# Patient Record
Sex: Female | Born: 1950 | Race: White | Hispanic: No | Marital: Married | Smoking: Never smoker
Health system: Southern US, Community
[De-identification: ages and names within clinical notes are randomized; demographics above are authoritative.]

## PROBLEM LIST (undated history)

## (undated) DIAGNOSIS — C801 Malignant (primary) neoplasm, unspecified: Secondary | ICD-10-CM

## (undated) DIAGNOSIS — M810 Age-related osteoporosis without current pathological fracture: Secondary | ICD-10-CM

## (undated) DIAGNOSIS — E079 Disorder of thyroid, unspecified: Secondary | ICD-10-CM

## (undated) HISTORY — PX: NO PAST SURGERIES: SHX2092

## (undated) HISTORY — DX: Age-related osteoporosis without current pathological fracture: M81.0

---

## 2006-10-27 ENCOUNTER — Ambulatory Visit: Payer: Self-pay | Admitting: Nurse Practitioner

## 2007-03-18 ENCOUNTER — Ambulatory Visit: Payer: Self-pay | Admitting: Unknown Physician Specialty

## 2007-11-10 ENCOUNTER — Ambulatory Visit: Payer: Self-pay | Admitting: Nurse Practitioner

## 2008-12-17 ENCOUNTER — Ambulatory Visit: Payer: Self-pay | Admitting: Nurse Practitioner

## 2008-12-20 ENCOUNTER — Ambulatory Visit: Payer: Self-pay | Admitting: Nurse Practitioner

## 2009-12-21 ENCOUNTER — Ambulatory Visit: Payer: Self-pay | Admitting: Internal Medicine

## 2009-12-26 ENCOUNTER — Ambulatory Visit: Payer: Self-pay | Admitting: Family Medicine

## 2011-01-28 ENCOUNTER — Ambulatory Visit: Payer: Self-pay

## 2012-01-29 ENCOUNTER — Ambulatory Visit: Payer: Self-pay | Admitting: Family Medicine

## 2013-02-01 ENCOUNTER — Ambulatory Visit: Payer: Self-pay | Admitting: Family Medicine

## 2014-10-20 ENCOUNTER — Ambulatory Visit: Payer: Managed Care, Other (non HMO)

## 2014-10-20 ENCOUNTER — Ambulatory Visit
Admission: EM | Admit: 2014-10-20 | Discharge: 2014-10-20 | Disposition: A | Discharge status: Home / Self Care | Payer: Managed Care, Other (non HMO) | Attending: Family Medicine | Admitting: Family Medicine

## 2014-10-20 ENCOUNTER — Encounter: Payer: Self-pay | Admitting: Emergency Medicine

## 2014-10-20 DIAGNOSIS — S41112A Laceration without foreign body of left upper arm, initial encounter: Secondary | ICD-10-CM

## 2014-10-20 DIAGNOSIS — S60312A Abrasion of left thumb, initial encounter: Secondary | ICD-10-CM

## 2014-10-20 DIAGNOSIS — S60212A Contusion of left wrist, initial encounter: Secondary | ICD-10-CM

## 2014-10-20 DIAGNOSIS — S61512A Laceration without foreign body of left wrist, initial encounter: Secondary | ICD-10-CM

## 2014-10-20 HISTORY — DX: Disorder of thyroid, unspecified: E07.9

## 2014-10-20 MED ORDER — BACITRACIN ZINC 500 UNIT/GM EX OINT: TOPICAL_OINTMENT | Freq: Once | CUTANEOUS | Status: DC

## 2014-10-20 MED ORDER — LIDOCAINE HCL (PF) 1 % IJ SOLN: 10.0000 mL | Freq: Once | INTRAMUSCULAR | Status: DC

## 2014-10-20 NOTE — ED Notes (Signed)
Was walking in her house and fell and glass jars from grocery broke and cut her right wrist and right thumb

## 2014-10-20 NOTE — Discharge Instructions (Signed)
Keep area clean and dry. Clean daily with soap and water, rinse, pat dry, then apply topical antibiotic ointment such as Neosporin. Keep covered when at work but allow some open to air time when at home as discussed.  Wear brace for 3 or 4 days to allow laceration to heal and rest wrist.  Return to the urgent care in 7-10 days for suture removal. Return sooner for redness, increased pain, swelling, drainage, new or worsening concerns.    Laceration Care, Adult A laceration is a cut that goes through all layers of the skin. The cut goes into the tissue beneath the skin. HOME CARE For stitches (sutures) or staples:  Keep the cut clean and dry.  If you have a bandage (dressing), change it at least once a day. Change the bandage if it gets wet or dirty, or as told by your doctor.  Wash the cut with soap and water 2 times a day. Rinse the cut with water. Pat it dry with a clean towel.  Put a thin layer of medicated cream on the cut as told by your doctor.  You may shower after the first 24 hours. Do not soak the cut in water until the stitches are removed.  Only take medicines as told by your doctor.  Have your stitches or staples removed as told by your doctor. For skin adhesive strips:  Keep the cut clean and dry.  Do not get the strips wet. You may take a bath, but be careful to keep the cut dry.  If the cut gets wet, pat it dry with a clean towel.  The strips will fall off on their own. Do not remove the strips that are still stuck to the cut. For wound glue:  You may shower or take baths. Do not soak or scrub the cut. Do not swim. Avoid heavy sweating until the glue falls off on its own. After a shower or bath, pat the cut dry with a clean towel.  Do not put medicine on your cut until the glue falls off.  If you have a bandage, do not put tape over the glue.  Avoid lots of sunlight or tanning lamps until the glue falls off. Put sunscreen on the cut for the first year to  reduce your scar.  The glue will fall off on its own. Do not pick at the glue. You may need a tetanus shot if:  You cannot remember when you had your last tetanus shot.  You have never had a tetanus shot. If you need a tetanus shot and you choose not to have one, you may get tetanus. Sickness from tetanus can be serious. GET HELP RIGHT AWAY IF:   Your pain does not get better with medicine.  Your arm, hand, leg, or foot loses feeling (numbness) or changes color.  Your cut is bleeding.  Your joint feels weak, or you cannot use your joint.  You have painful lumps on your body.  Your cut is red, puffy (swollen), or painful.  You have a red line on the skin near the cut.  You have yellowish-white fluid (pus) coming from the cut.  You have a fever.  You have a bad smell coming from the cut or bandage.  Your cut breaks open before or after stitches are removed.  You notice something coming out of the cut, such as wood or glass.  You cannot move a finger or toe. MAKE SURE YOU:   Understand these instructions.  Will watch your condition.  Will get help right away if you are not doing well or get worse. Document Released: 07/29/2007 Document Revised: 05/04/2011 Document Reviewed: 08/05/2010 [facility] Patient Information 2015 [place], [place]. This information is not intended to replace advice given to you by your health care provider. Make sure you discuss any questions you have with your health care provider.  Abrasion An abrasion is a cut or scrape of the skin. Abrasions do not extend through all layers of the skin and most heal within 10 days. It is important to care for your abrasion properly to prevent infection. CAUSES  Most abrasions are caused by falling on, or gliding across, the ground or other surface. When your skin rubs on something, the outer and inner layer of skin rubs off, causing an abrasion. DIAGNOSIS  Your caregiver will be able to diagnose an abrasion  during a physical exam.  TREATMENT  Your treatment depends on how large and deep the abrasion is. Generally, your abrasion will be cleaned with water and a mild soap to remove any dirt or debris. An antibiotic ointment may be put over the abrasion to prevent an infection. A bandage (dressing) may be wrapped around the abrasion to keep it from getting dirty.  You may need a tetanus shot if:  You cannot remember when you had your last tetanus shot.  You have never had a tetanus shot.  The injury broke your skin. If you get a tetanus shot, your arm may swell, get red, and feel warm to the touch. This is common and not a problem. If you need a tetanus shot and you choose not to have one, there is a rare chance of getting tetanus. Sickness from tetanus can be serious.  HOME CARE INSTRUCTIONS   If a dressing was applied, change it at least once a day or as directed by your caregiver. If the bandage sticks, soak it off with warm water.   Wash the area with water and a mild soap to remove all the ointment 2 times a day. Rinse off the soap and pat the area dry with a clean towel.   Reapply any ointment as directed by your caregiver. This will help prevent infection and keep the bandage from sticking. Use gauze over the wound and under the dressing to help keep the bandage from sticking.   Change your dressing right away if it becomes wet or dirty.   Only take over-the-counter or prescription medicines for pain, discomfort, or fever as directed by your caregiver.   Follow up with your caregiver within 24-48 hours for a wound check, or as directed. If you were not given a wound-check appointment, look closely at your abrasion for redness, swelling, or pus. These are signs of infection. SEEK IMMEDIATE MEDICAL CARE IF:   You have increasing pain in the wound.   You have redness, swelling, or tenderness around the wound.   You have pus coming from the wound.   You have a fever or persistent  symptoms for more than 2-3 days.  You have a fever and your symptoms suddenly get worse.  You have a bad smell coming from the wound or dressing.  MAKE SURE YOU:   Understand these instructions.  Will watch your condition.  Will get help right away if you are not doing well or get worse. Document Released: 11/19/2004 Document Revised: 01/27/2012 Document Reviewed: 01/13/2011 [facility] Patient Information 2015 [place], [place]. This information is not intended to replace advice given to  you by your health care provider. Make sure you discuss any questions you have with your health care provider.  Contusion A contusion is a deep bruise. Contusions happen when an injury causes bleeding under the skin. Signs of bruising include pain, puffiness (swelling), and discolored skin. The contusion may turn blue, purple, or yellow. HOME CARE   Put ice on the injured area.  Put ice in a plastic bag.  Place a towel between your skin and the bag.  Leave the ice on for 15-20 minutes, 03-04 times a day.  Only take medicine as told by your doctor.  Rest the injured area.  If possible, raise (elevate) the injured area to lessen puffiness. GET HELP RIGHT AWAY IF:   You have more bruising or puffiness.  You have pain that is getting worse.  Your puffiness or pain is not helped by medicine. MAKE SURE YOU:   Understand these instructions.  Will watch your condition.  Will get help right away if you are not doing well or get worse. Document Released: 07/29/2007 Document Revised: 05/04/2011 Document Reviewed: 12/15/2010 [facility] Patient Information 2015 [place], [place]. This information is not intended to replace advice given to you by your health care provider. Make sure you discuss any questions you have with your health care provider.

## 2014-10-20 NOTE — ED Provider Notes (Signed)
[facility] Emergency Department Provider Note  ____________________________________________  Time seen: Approximately 12:10 PM  I have reviewed the triage vital signs and the nursing notes.   HISTORY  Chief Complaint Laceration   HPI Janice Wilkinson is a 64 y.o. female presents due to laceration. Patient reports that just prior to arrival she and her husband were bringing in groceries. Patient states that she has 4 brick steps into her house and states that she tripped at the bottom step causing her to fall forward. Patient states that she caught self with the right wrist but states glass spaghetti jar broke at the same time if she catching herself causing lacerations to right wrist, right arm and right hand.Patient reports mild pain to right wrist. Denies head injury or loss of consciousness. Denies other injury. States current pain is 3 out of 10 described as aching pain. Denies numbness or tingling sensation. Denies difficulty or inability to use fingers.  Reports last tetanus immunization less than 5 years ago.   Past Medical History  Diagnosis Date  . Thyroid disease     There are no active problems to display for this patient.   Past Surgical History  Procedure Laterality Date  . No past surgeries      Current Outpatient Rx  Name  Route  Sig  Dispense  Refill  . fluticasone (FLONASE) 50 MCG/ACT nasal spray   Each Nare   Place into both nostrils daily.         Marland Kitchen levothyroxine (SYNTHROID, LEVOTHROID) 88 MCG tablet   Oral   Take 88 mcg by mouth daily before breakfast.           Allergies Review of patient's allergies indicates no known allergies.  No family history on file.  Social History Social History  Substance Use Topics  . Smoking status: Never Smoker   . Smokeless tobacco: Never Used  . Alcohol Use: 0.6 oz/week    1 Glasses of wine per week    Review of Systems Constitutional: No fever/chills Eyes: No visual  changes. ENT: No sore throat. Cardiovascular: Denies chest pain. Respiratory: Denies shortness of breath. Gastrointestinal: No abdominal pain.  No nausea, no vomiting.  No diarrhea.  No constipation. Genitourinary: Negative for dysuria. Musculoskeletal: Negative for back pain.right wrist pain.  Skin: Negative for rash.right arm laceration as above.  Neurological: Negative for headaches, focal weakness or numbness.  10-point ROS otherwise negative.  ____________________________________________   PHYSICAL EXAM:  VITAL SIGNS: ED Triage Vitals  Enc Vitals Group     BP 10/20/14 1135 142/70 mmHg     Pulse Rate 10/20/14 1135 64     Resp 10/20/14 1135 16     Temp 10/20/14 1135 98.1 F (36.7 C)     Temp Source 10/20/14 1135 Tympanic     SpO2 10/20/14 1135 98 %     Weight 10/20/14 1135 155 lb (70.308 kg)     Height 10/20/14 1135 5\' 6"  (1.676 m)     Head Cir --      Peak Flow --      Pain Score 10/20/14 1137 4     Pain Loc --      Pain Edu? --      Excl. in [place]? --     Constitutional: Alert and oriented. Well appearing and in no acute distress. Eyes: Conjunctivae are normal. PERRL. EOMI. Head: Atraumatic.  Nose: No congestion/rhinnorhea.  Mouth/Throat: Mucous membranes are moist.  Neck: No stridor.  No cervical spine  tenderness to palpation. Hematological/Lymphatic/Immunilogical: No cervical lymphadenopathy. Cardiovascular: Normal rate, regular rhythm. Grossly normal heart sounds.  Good peripheral circulation. Respiratory: Normal respiratory effort.  No retractions. Lungs CTAB. Gastrointestinal: Soft and nontender. No distention. Normal Bowel sounds.   Musculoskeletal: No lower or upper extremity tenderness nor edema.  No joint effusions.. No cervical, thoracic or lumbar tenderness to palpation. Full range of motion to bilateral upper and lower extremities. Except: Right lateral wrist mild TTP with laceration present, full ROM. No motor or tendon deficit to right wrist or right  hand fingers or right forearm.   Neurologic:  Normal speech and language. No gross focal neurologic deficits are appreciated. No gait instability. Skin:  Skin is warm, dry and intact. No rash noted. Except: right lateral wrist 6 cm superficial laceration, mild TTP, no palpable or visualized foreign body, no tendon exposure. Right palmer surface distal thumb superficial abrasions, no laceration repair indicated to thumb, right thumb full rOM, no motor, tendon or sensation deficits. Right lateral forearm with small x3 superficial <0.5 cm laceration, nontender.  Psychiatric: Mood and affect are normal. Speech and behavior are normal.  ____________________________________________   LABS (all labs ordered are listed, but only abnormal results are displayed)  Labs Reviewed - No data to display  RADIOLOGY EXAM: RIGHT WRIST - COMPLETE 3+ VIEW  COMPARISON: Right thumb radiographs - earlier same day  FINDINGS: No fracture or dislocation. Joint spaces are preserved. There are 2 punctate ossicles adjacent to the distal aspect of the proximal phalanx of the thumb. Regional soft tissues appear otherwise normal. No radiopaque foreign body.  IMPRESSION: No fracture, dislocation or radiopaque foreign body.   Electronically Signed By: Sandi Mariscal M.D. On: 10/20/2014 12:31          DG Finger Thumb Right (Final result) Result time: 10/20/14 12:33:53   Final result by Rad Results In Interface (10/20/14 12:33:53)   Narrative:   CLINICAL DATA: 64 year old female with laceration in the lateral aspect of the wrist in the anterior aspect of the thumb from glass.  EXAM: RIGHT THUMB 2+V  COMPARISON: No priors.  FINDINGS: There is no evidence of fracture or dislocation. 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 There is no focal bone abnormality. Soft tissues are unremarkable  IMPRESSION: Negative.   Electronically Signed By: Vinnie Langton M.D. On: 10/20/2014 12:33   I, Marylene Land, personally viewed and evaluated these images (plain radiographs) as part of my medical decision making.   ____________________________________________   PROCEDURES  Procedure(s) performed:  Procedure explained and verbal consent obtained.  Location: Right wrist Size: 6cm Anesthesia with 1% Lidocaine  Wound cleansed, debrided of visible foreign material.  Copious irrigation with saline and betadine.  Repaired with 5-0 nylon sutures Simple interrupted Suture # 11   Right thumb and x 3 right forearm lacerations, no suture repair indicated. Wound cleansed, debrided of visible foreign material.  Copious irrigation with saline and betadine. X one steristrip applied to each wound (x 4 total).   Antibiotic ointment and dressing applied.  Wound care instructions provided.  Observe for any signs of infection or other problems.  Patient tolerate procedure well.   Right wrist velcro splint applied by RN. Neurovascular intact post application.  ______________________________________   INITIAL IMPRESSION / ASSESSMENT AND PLAN / ED COURSE  Pertinent labs & imaging results that were available during my care of the patient were reviewed by me and considered in my medical decision making (see chart for details).  Presents for complaint of  laceration post mechanical fall. Denies head injury or LOC. Lacerations repaired.Dressing applied, right wrist velcro splint applied for support.  Patient tolerated well. REturn to urgent care for suture removal in 7-10 days. Discussed follow up and return parameters. Patient and family verbalized understanding and agreed to plan.  ____________________________________________   FINAL CLINICAL IMPRESSION(S) / ED DIAGNOSES  Final diagnoses:  Wrist laceration, left, initial encounter  Arm laceration, left, initial encounter  Abrasion of left thumb, initial encounter  Wrist contusion, left, initial  encounter       Marylene Land, NP 10/20/14 1611

## 2014-12-04 ENCOUNTER — Encounter: Payer: Self-pay | Admitting: Physical Therapy

## 2014-12-04 ENCOUNTER — Ambulatory Visit: Payer: Managed Care, Other (non HMO) | Attending: Obstetrics and Gynecology | Admitting: Physical Therapy

## 2014-12-04 VITALS — BP 132/100

## 2014-12-04 DIAGNOSIS — R279 Unspecified lack of coordination: Secondary | ICD-10-CM | POA: Diagnosis not present

## 2014-12-04 DIAGNOSIS — N8189 Other female genital prolapse: Secondary | ICD-10-CM | POA: Diagnosis present

## 2014-12-04 DIAGNOSIS — M629 Disorder of muscle, unspecified: Secondary | ICD-10-CM | POA: Diagnosis present

## 2014-12-04 NOTE — Patient Instructions (Addendum)
   PELVIC FLOOR / KEGEL EXERCISES   Pelvic floor/ Kegel exercises are used to strengthen the muscles in the base of your pelvis that are responsible for supporting your pelvic organs and preventing urine/feces leakage. Based on your therapist's recommendations, they can be performed while standing, sitting, or lying down. Imagine pelvic floor area as a diamond with pelvic landmarks: top =pubic bone, bottom tip=tailbone, sides=sitting bones (ischial tuberosities).    Make yourself aware of this muscle group by using these cues while coordinating your breath:  Inhale, feel pelvic floor diamond area lower like hammock towards your feet and ribcage/belly expanding. Pause. Let the exhale naturally and feel your belly sink, abdominal muscles hugging in around you and you may notice the pelvic diamond draws upward towards your head forming a umbrella shape. Give a squeeze during the exhalation like you are stopping the flow of urine. If you are squeezing the buttock muscles, try to give 50% less effort.   Common Errors:  Breath holding: If you are holding your breath, you may be bearing down against your bladder instead of pulling it up. If you belly bulges up while you are squeezing, you are holding your breath. Be sure to breathe gently in and out while exercising. Counting out loud may help you avoid holding your breath.  Accessory muscle use: You should not see or feel other muscle movement when performing pelvic floor exercises. When done properly, no one can tell that you are performing the exercises. Keep the buttocks, belly and inner thighs relaxed.  Overdoing it: Your muscles can fatigue and stop working for you if you over-exercise. You may actually leak more or feel soreness at the lower abdomen or rectum.  YOUR HOME EXERCISE PROGRAM  LONG HOLDS: Position: on back, with pillow propped under hips  Inhale and then exhale. Then squeeze the muscle and count aloud for 10 seconds. Rest with  three long breaths. (Be sure to let belly sink in with exhales and not push outward)  Perform 5 repetitions, 3 times/day                   DECREASE DOWNWARD PRESSURE ON  YOUR PELVIC FLOOR, ABDOMINAL, LOW BACK MUSCLES       PRESERVE YOUR PELVIC HEALTH LONG-TERM   ** SQUEEZE pelvic floor BEFORE YOUR SNEEZE, COUGH, LAUGH   ** EXHALE BEFORE YOU RISE AGAINST GRAVITY (lifting, sit to stand, from squat to stand)   ** LOG ROLL OUT OF BED INSTEAD OF CRUNCH/SIT-UP   ** GETTING INTO CAR WITH BUTTOCKS FIRST AND THEN MOVING LEGS in

## 2014-12-05 NOTE — Therapy (Signed)
[place] MAIN [facility] SERVICES [address] [place], [place], [address] Phone: [phone]   Fax:  [phone]  Physical Therapy Evaluation  Patient Details  Name: Janice Wilkinson MRN: [id number] Date of Birth: Mar 26, 1950 Referring Provider:  Benjaman Kindler, MD  Encounter Date: 12/04/2014      PT End of Session - 12/05/14 2304    Visit Number 1   Number of Visits 12   Date for PT Re-Evaluation 02/19/15   PT Start Time 1779   PT Stop Time 1815   PT Time Calculation (min) 70 min   Activity Tolerance Patient tolerated treatment well   Behavior During Therapy [facility] for tasks assessed/performed      Past Medical History  Diagnosis Date  . Thyroid disease     hypothyorid    Past Surgical History  Procedure Laterality Date  . No past surgeries      Filed Vitals:   12/04/14 1712  BP: 132/100    Visit Diagnosis:  Lack of coordination - Plan: PT plan of care cert/re-cert  Fascial defect - Plan: PT plan of care cert/re-cert  Pelvic floor weakness - Plan: PT plan of care cert/re-cert      Subjective Assessment - 12/04/14 1715    Subjective Pt started feeling prolapse symptoms in late July when pt was weeding with repetitive stooping on ground and standing. Pt felt something hanging in the vagina and it was confirmed with a hand-held mirror. Pt went to see Dr. Leafy Ro and was informed that she had a prolapse of her uterus and bladder.  Pt was prescribed estrogen cream to build vaginal tissues.  Pt understood her options for PT, pessary, and surgery. Pt denies SUI but started to notice urge to urinate with some days worse than other days. Pt is unsure what causes the urge to come on.  Denied dyspareunia and bowel dysfunction. Daily fluid intake 4 (6-8 oz ) water, 2 cups of caffeinated tea.  Denied pain in all areas of the body.      Pertinent History walking M-F daily 20-30 min, Hx vaginal birth  (> 9 lbs) and with vacuum and forceps,  Regular bowel movements with Type 4-5.    Patient Stated Goals be able to lower into carchair  without lowered pelvic  feeling , and the lift pelvic organ up            [facility] PT Assessment - 12/05/14 1235    Assessment   Medical Diagnosis prolapse   Precautions   Precautions None   Restrictions   Weight Bearing Restrictions No   Balance Screen   Has the patient fallen in the past 6 months Yes   How many times? 1   Has the patient had a decrease in activity level because of a fear of falling?  No   Is the patient reluctant to leave their home because of a fear of falling?  No   Prior Function   Level of Independence Independent   Observation/Other Assessments   Observations significant thoracic kyphosis   upper cross syndrome   Other Surveys  --  POP-Q 6 : 29%    Posture/Postural Control   Posture Comments limited diaphragmatic excursion , able to coordinate w/ minimal cues (initially chest breathing)    AROM   Overall AROM Comments limited R side rotation > L, limited thoracic extension in standing                  Pelvic  Floor Special Questions - 12/05/14 1232    Diastasis Recti neg   Prolapse Posterior Wall   Prolapse other noted descent  w/ cue for coughing in hooklying and static standing without any cues (gravity as the only exertional force)    Pelvic Floor Internal Exam pt verbally consented without contraindications   Exam Type Vaginal   Strength fair squeeze, definite lift  required pillow, facilitation on L mm for more circumferential   Strength # of reps 5   Strength # of seconds 10          [facility] Adult PT Treatment/Exercise - 12/05/14 1235    Self-Care   Self-Care --  ways to decrease downward pressure on pelvic floor mm    Neuro Re-ed    Neuro Re-ed Details  pelvic floor contractions   log rolling, toileting posture                 PT Education - 12/05/14 2303    Education provided Yes   Education Details HEP, POC, anatomy/  physiology, ways to minimize prolapse from worsening, goals   Person(s) Educated Patient   Methods Explanation;Demonstration;Tactile cues;Verbal cues;Handout   Comprehension Verbalized understanding;Returned demonstration             PT Long Term Goals - 12/04/14 1810    PT LONG TERM GOAL #1   Title Pt will decrease her score on Prolapse Questionnaire POP-Q 6  from 29% to 25% in order to improve QOL.    Time 12   Period Weeks   Status New   PT LONG TERM GOAL #2   Title Pt will be able to lower into carseat without lowered pelvic feeling across 3 reps in order to improve QOL.    Time 12   Period Weeks   Status New   PT LONG TERM GOAL #3   Title Pt report not feeling an urge to urinate when descending a hill in order to demo improved pelvic organ suuport for hiking with family.    Time 12   Period Weeks   Status New   PT LONG TERM GOAL #4   Title Pt will demonstrates ways to modifiy gardening activities to minimize relapse of symptoms with decreased pressure on pelvic floor muscles.     Time 12   Period Weeks   Status New   PT LONG TERM GOAL #5   Title Pt will be able to demo floor to standing 3 reps without report of a lowering pelvic feeling in order to gardening.    Time 12   Period Weeks   Status New               Plan - 12/05/14 2305    Clinical Impression Statement Pt is a 64 yo female whose S & Sx consist of limited spinal mobility, poor coordination/endurance of deep core core mm, limited diaphragmatic excursion, mm tensions along midback and poor posture. These deficits poses her at risk for worsening her prolapse and  affect her car transfers as well as her participation with gardening.     Pt will benefit from skilled therapeutic intervention in order to improve on the following deficits Abnormal gait;Decreased coordination;Difficulty walking;Decreased range of motion;Increased muscle spasms;Decreased safety awareness;Decreased endurance;Decreased activity  tolerance;Hypomobility;Decreased scar mobility;Decreased balance;Decreased strength;Decreased mobility;Postural dysfunction;Improper body mechanics;Impaired flexibility   Rehab Potential Good   PT Frequency 1x / week   PT Duration 12 weeks   PT Treatment/Interventions ADLs/Self Care Home Management;Aquatic Therapy;Biofeedback;Moist Heat;Therapeutic activities;Dry needling;Energy conservation;Therapeutic  exercise;Balance training;Neuromuscular re-education;Patient/family education;Functional mobility training;Stair training;Gait training;Cryotherapy;Passive range of motion;Scar mobilization;Compression bandaging;Manual techniques;Taping;Electrical Stimulation;Traction   PT Next Visit Plan thoracic release   Consulted and Agree with Plan of Care Patient         Problem List There are no active problems to display for this patient.   Jerl Mina  ,PT, DPT, E-RYT  12/05/2014, 11:16 PM  [place] MAIN [facility] SERVICES [address] [place], [place], [address] Phone: [phone]   Fax:  [phone]

## 2014-12-13 ENCOUNTER — Encounter: Payer: Managed Care, Other (non HMO) | Admitting: Physical Therapy

## 2014-12-14 ENCOUNTER — Ambulatory Visit: Payer: Managed Care, Other (non HMO) | Admitting: Physical Therapy

## 2014-12-14 DIAGNOSIS — M629 Disorder of muscle, unspecified: Secondary | ICD-10-CM

## 2014-12-14 DIAGNOSIS — R279 Unspecified lack of coordination: Secondary | ICD-10-CM

## 2014-12-14 DIAGNOSIS — N8189 Other female genital prolapse: Secondary | ICD-10-CM

## 2014-12-14 NOTE — Patient Instructions (Signed)
Open book, sidelying, standing, and sested  Seated posture alignment and deep core activation (quick squeezes 5 x )    Pelvic floor progression 10 sec counting aloud, and decrease use of shoulders/ chest breathing,  4 reps x 3 x time a day

## 2014-12-14 NOTE — Therapy (Signed)
[place] MAIN [facility] SERVICES [address] [place], [place], [address] Phone: [phone]   Fax:  [phone]  Physical Therapy Treatment  Patient Details  Name: Janice Wilkinson MRN: [id number] Date of Birth: 06-22-1950 No Data Recorded  Encounter Date: 12/14/2014      PT End of Session - 12/14/14 1696    Visit Number 2   Number of Visits 12   Date for PT Re-Evaluation 02/19/15   PT Start Time 0805   PT Stop Time 0905   PT Time Calculation (min) 60 min   Activity Tolerance Patient tolerated treatment well   Behavior During Therapy [facility] for tasks assessed/performed      Past Medical History  Diagnosis Date  . Thyroid disease     hypothyorid    Past Surgical History  Procedure Laterality Date  . No past surgeries      There were no vitals filed for this visit.  Visit Diagnosis:  Lack of coordination  Fascial defect  Pelvic floor weakness      Subjective Assessment - 12/14/14 0905    Subjective Pt reported she has not been doing her HEP as much as she would like due to caretaking for her father -in-law. Pt has been trying to catch herself with placing her feet on the ground for more upright sitting posture.    Pertinent History walking M-F daily 20-30 min, Hx vaginal birth  (> 9 lbs) and with vacuum and forceps, Regular bowel movements with Type 4-5.    Patient Stated Goals be able to lower into carchair  without lowered pelvic  feeling , and the lift pelvic organ up            [facility] PT Assessment - 12/14/14 0910    Coordination   Gross Motor Movements are Fluid and Coordinated --  improved breathign and pelvic floor coordinatino    Posture/Postural Control   Posture Comments increased diaphragmatic excursion and pelvic floor grade strength post-thoracic releases    Palpation   Spinal mobility tenderness and hypomobillity at T7, 10, R TP throughout T7-12 > L , paraspinal mm tensions R > L    SI assessment      Bed  Mobility   Bed Mobility --  required cuing for log rolling                   Pelvic Floor Special Questions - 12/14/14 0909    Pelvic Floor Internal Exam pt verbally consented without contraindications   Exam Type Vaginal   Strength fair squeeze, definite lift  pre-Tx 2/5 posterior > anterior, post-Tx 3/5 without pillow    Strength # of reps 4   Strength # of seconds 10  2 sets, last set w/ moist  heat on back hooklying            [facility] Adult PT Treatment/Exercise - 12/14/14 0910    Neuro Re-ed    Neuro Re-ed Details  decreased breathholing, verbal, tactile cuing for pelvic floor activation, cues for decreasing overuse of upper traps with breathing    Moist Heat Therapy   Number Minutes Moist Heat 10 Minutes  no charge    Moist Heat Location Other (comment)  back. skin intact post-Tx and no complaints from pt    Manual Therapy   Joint Mobilization PA mobs Grade III 90 sec T7, T10, Grade II TP along R T7-T12   Soft tissue mobilization gliding, stripping, along R paraspinals > L  Internal Pelvic Floor faciliating anterior mm                 PT Education - 12/14/14 0918    Education provided Yes   Education Details HEP   Person(s) Educated Patient   Methods Explanation;Tactile cues;Handout;Verbal cues;Demonstration   Comprehension Verbalized understanding;Returned demonstration             PT Long Term Goals - 12/04/14 1810    PT LONG TERM GOAL #1   Title Pt will decrease her score on Prolapse Questionnaire POP-Q 6  from 29% to 25% in order to improve QOL.    Time 12   Period Weeks   Status New   PT LONG TERM GOAL #2   Title Pt will be able to lower into carseat without lowered pelvic feeling across 3 reps in order to improve QOL.    Time 12   Period Weeks   Status New   PT LONG TERM GOAL #3   Title Pt report not feeling an urge to urinate when descending a hill in order to demo improved pelvic organ suuport for hiking with family.    Time  12   Period Weeks   Status New   PT LONG TERM GOAL #4   Title Pt will demonstrates ways to modify gardening activities to minimize relapse of symptoms with decreased pressure on pelvic floor muscles.     Time 12   Period Weeks   Status New   PT LONG TERM GOAL #5   Title Pt will be able to demo floor to standing 3 reps without report of a lowering pelvic feeling in order to gardening.    Time 12   Period Weeks   Status New               Plan - 12/14/14 0802    Clinical Impression Statement Pt showed increased pelvic floor grade strength after thoracic mm and spine releases faciliated increased diaphragmatic excursion and lesss rounded shoulders/ thoracic kyphosis. Pt was able to demo proper sitting posture with enagegment of deep core mm. Pt continues to benefit from skilled PT in order to address spinal mobility limitations and pelvic floor weakness. Pt requried moderating cuing with review of log rolling technique instead of sit up position out of bed in order to minimize downward please on pelvic floor.      Pt will benefit from skilled therapeutic intervention in order to improve on the following deficits Abnormal gait;Decreased coordination;Difficulty walking;Decreased range of motion;Increased muscle spasms;Decreased safety awareness;Decreased endurance;Decreased activity tolerance;Hypomobility;Decreased scar mobility;Decreased balance;Decreased strength;Decreased mobility;Postural dysfunction;Improper body mechanics;Impaired flexibility   Rehab Potential Good   PT Frequency 1x / week   PT Duration 12 weeks   PT Treatment/Interventions ADLs/Self Care Home Management;Aquatic Therapy;Biofeedback;Moist Heat;Therapeutic activities;Dry needling;Energy conservation;Therapeutic exercise;Balance training;Neuromuscular re-education;Patient/family education;Functional mobility training;Stair training;Gait training;Cryotherapy;Passive range of motion;Scar mobilization;Compression  bandaging;Manual techniques;Taping;Electrical Stimulation;Traction   PT Next Visit Plan thoracic release   Consulted and Agree with Plan of Care Patient        Problem List There are no active problems to display for this patient.   Jerl Mina  ,PT, DPT, E-RYT  12/14/2014, 9:24 AM  [place] MAIN [facility] SERVICES [address] [place], [place], [address] Phone: [phone]   Fax:  [phone]  Name: Janice Wilkinson MRN: [id number] Date of Birth: 11/08/1950

## 2014-12-20 ENCOUNTER — Ambulatory Visit: Payer: Managed Care, Other (non HMO) | Admitting: Physical Therapy

## 2014-12-20 DIAGNOSIS — M629 Disorder of muscle, unspecified: Secondary | ICD-10-CM

## 2014-12-20 DIAGNOSIS — R279 Unspecified lack of coordination: Secondary | ICD-10-CM | POA: Diagnosis not present

## 2014-12-20 DIAGNOSIS — N8189 Other female genital prolapse: Secondary | ICD-10-CM

## 2014-12-20 NOTE — Patient Instructions (Addendum)
childs pose -3 way  5 breaths each way          child pose rocking  5 x        Open book 15 x      Pelvic floor mm exercises without pillow and more pelvic floor lift on exhale, diaphragmatic expansion on inhale. Increase 10 se holds to 5 reps   Squeezing when laughing and coughing, sneezing     Towel modification for car seat

## 2014-12-21 NOTE — Therapy (Signed)
[place] MAIN [facility] SERVICES [address] [place], [place], [address] Phone: [phone]   Fax:  [phone]  Physical Therapy Treatment  Patient Details  Name: Janice Wilkinson MRN: [id number] Date of Birth: Jan 19, 1951 Referring Provider: Leafy Ro  Encounter Date: 12/20/2014      PT End of Session - 12/21/14 2225    Visit Number 3   Number of Visits 12   Date for PT Re-Evaluation 02/19/15   Activity Tolerance Patient tolerated treatment well   Behavior During Therapy [facility] for tasks assessed/performed      Past Medical History  Diagnosis Date  . Thyroid disease     hypothyorid    Past Surgical History  Procedure Laterality Date  . No past surgeries      There were no vitals filed for this visit.  Visit Diagnosis:  Lack of coordination  Fascial defect  Pelvic floor weakness      Subjective Assessment - 12/20/14 1752    Subjective Pt reported she has been doing her HEP and as practiced better posture at work. Pt stated she was sore for two days after last session.    Pertinent History walking M-F daily 20-30 min, Hx vaginal birth  (> 9 lbs) and with vacuum and forceps, Regular bowel movements with Type 4-5.    Patient Stated Goals be able to lower into carchair  without lowered pelvic  feeling , and the lift pelvic organ up            [facility] PT Assessment - 12/21/14 2217    Assessment   Medical Diagnosis prolapse   Referring Provider [facility]   Coordination   Gross Motor Movements are Fluid and Coordinated --  pre-Tx: limited diaphragmatic excursion, Post-Tx: increased   Palpation   Palpation comment increased parapsinal/ midback mm tensions, limited scapulothoracic mobility (post-Tx: increased mobility, increasd flexibility)                  Pelvic Floor Special Questions - 12/21/14 2220    Pelvic Floor Internal Exam pt verbally consented without contraindications   Exam Type Vaginal   Strength good  squeeze, good lift, able to hold agaisnt strong resistance  without pillow,  with more cephalad position of bladder      Strength # of reps 5   Strength # of seconds 10           [facility] Adult PT Treatment/Exercise - 12/21/14 2221    Bed Mobility   Bed Mobility --  required cuing for log rolling    Posture/Postural Control   Posture Comments increased diaphragmatic excursion and pelvic floor grade strength post-thoracic releases    Neuro Re-ed    Neuro Re-ed Details   verbal, tactile cuing for pelvic floor activation, cues for decreasing overuse of upper traps with breathing   pelvic squeeze with cough, laughter, sneezing   Exercises   Exercises --  see pt instructions   Moist Heat Therapy   Moist Heat Location Other (comment)  back. skin intact post-Tx and no complaints from pt    Manual Therapy   Joint Mobilization PA mobs Grade III 90 sec T7, T10, Grade II TP along R T7-T12  inf/sup SCJ   Soft tissue mobilization gliding, stripping medial scapula bilaterally   pectoralis, mid back    Internal Pelvic Floor                  PT Education - 12/21/14 2224    Education provided  Yes   Education Details HEP   Person(s) Educated Patient   Methods Explanation;Demonstration;Tactile cues;Verbal cues;Handout   Comprehension Verbalized understanding;Returned demonstration             PT Long Term Goals - 12/21/14 2228    PT LONG TERM GOAL #1   Title Pt will decrease her score on Prolapse Questionnaire POP-Q 6  from 29% to 25% in order to improve QOL.    Time 12   Period Weeks   Status On-going   PT LONG TERM GOAL #2   Title Pt will be able to lower into carseat without lowered pelvic feeling across 3 reps in order to improve QOL.    Time 12   Period Weeks   Status On-going   PT LONG TERM GOAL #3   Title Pt report not feeling an urge to urinate when descending a hill in order to demo improved pelvic organ suuport for hiking with family.    Time 12   Period Weeks    Status On-going   PT LONG TERM GOAL #4   Title Pt will demonstrates ways to modify gardening activities to minimize relapse of symptoms with decreased pressure on pelvic floor muscles.     Time 12   Period Weeks   Status On-going   PT LONG TERM GOAL #5   Title Pt will be able to demo floor to standing 3 reps without report of a lowering pelvic feeling in order to gardening.    Time 12   Period Weeks   Status On-going               Plan - 12/21/14 2225    Clinical Impression Statement Pt continues to progress well with more cephalad position of pelvic organs, stronger pelvic floor mm contractions and longer endurance without a need for a pillow under hips. Initiated coordination of pelvic floor contraction with functional activities such as coughing/ sneezing./ laughing.  Pt will continue to require skilled PT to decrease mm tensions of the back and regain neutral spinal curves.     Pt will benefit from skilled therapeutic intervention in order to improve on the following deficits Abnormal gait;Decreased coordination;Difficulty walking;Decreased range of motion;Increased muscle spasms;Decreased safety awareness;Decreased endurance;Decreased activity tolerance;Hypomobility;Decreased scar mobility;Decreased balance;Decreased strength;Decreased mobility;Postural dysfunction;Improper body mechanics;Impaired flexibility   Rehab Potential Good   PT Frequency 1x / week   PT Duration 12 weeks   PT Treatment/Interventions ADLs/Self Care Home Management;Aquatic Therapy;Biofeedback;Moist Heat;Therapeutic activities;Dry needling;Energy conservation;Therapeutic exercise;Balance training;Neuromuscular re-education;Patient/family education;Functional mobility training;Stair training;Gait training;Cryotherapy;Passive range of motion;Scar mobilization;Compression bandaging;Manual techniques;Taping;Electrical Stimulation;Traction   PT Next Visit Plan thoracic release   Consulted and Agree with Plan of  Care Patient        Problem List There are no active problems to display for this patient.   Jerl Mina ,PT, DPT, E-RYT  12/21/2014, 10:29 PM  [place] MAIN [facility] SERVICES [address] [place], [place], [address] Phone: [phone]   Fax:  [phone]  Name: ILEANA CHALUPA MRN: [id number] Date of Birth: 09-28-1950

## 2014-12-27 ENCOUNTER — Ambulatory Visit: Payer: Managed Care, Other (non HMO) | Attending: Obstetrics and Gynecology | Admitting: Physical Therapy

## 2014-12-27 DIAGNOSIS — R279 Unspecified lack of coordination: Secondary | ICD-10-CM | POA: Diagnosis not present

## 2014-12-27 DIAGNOSIS — M629 Disorder of muscle, unspecified: Secondary | ICD-10-CM | POA: Insufficient documentation

## 2014-12-27 DIAGNOSIS — N8189 Other female genital prolapse: Secondary | ICD-10-CM | POA: Diagnosis present

## 2014-12-28 NOTE — Therapy (Signed)
[place] MAIN [facility] SERVICES [address] [place], [place], [address] Phone: [phone]   Fax:  [phone]  Physical Therapy Treatment  Patient Details  Name: Janice Wilkinson MRN: [id number] Date of Birth: 08-12-1950 Referring Provider: Leafy Ro  Encounter Date: 12/27/2014      PT End of Session - 12/28/14 2128    Visit Number 4   Number of Visits 12   Date for PT Re-Evaluation 02/19/15   PT Start Time 1700   PT Stop Time 1740   PT Time Calculation (min) 40 min   Activity Tolerance Patient tolerated treatment well   Behavior During Therapy [facility] for tasks assessed/performed      Past Medical History  Diagnosis Date  . Thyroid disease     hypothyorid    Past Surgical History  Procedure Laterality Date  . No past surgeries      There were no vitals filed for this visit.  Visit Diagnosis:  Lack of coordination  Fascial defect  Pelvic floor weakness      Subjective Assessment - 12/28/14 2114    Subjective Pt reported she tried the car modification with towels but it felt uncomfortable. Pt also tried doing quick squeezes while walking but found it to be difficult.    Pertinent History walking M-F daily 20-30 min, Hx vaginal birth  (> 9 lbs) and with vacuum and forceps, Regular bowel movements with Type 4-5.    Patient Stated Goals be able to lower into carchair  without lowered pelvic  feeling , and the lift pelvic organ up            [facility] PT Assessment - 12/28/14 2123    Assessment   Medical Diagnosis prolapse   Referring Provider Leafy Ro   Observation/Other Assessments   Observations more upright posture, less forward head                  Pelvic Floor Special Questions - 12/28/14 2134    Pelvic Floor Internal Exam pt verbally consented without contraindications   Exam Type Vaginal   Strength good squeeze, good lift, able to hold agaisnt strong resistance  without pillow,  with more cephalad  position of bladder      Strength # of reps 6   Strength # of seconds 10           [facility] Adult PT Treatment/Exercise - 12/28/14 2124    Therapeutic Activites    Other Therapeutic Activities towel modifications to car seat   reviewed profile pic of work station, advised raisingmonitor   Neuro Re-ed    Neuro Re-ed Details  dynamic stabilization 1-2 , 10 reps   forward gaze and upright posture when walking                 PT Education - 12/28/14 2124    Education provided Yes   Education Details HEP   Person(s) Educated Patient   Methods Explanation;Demonstration;Tactile cues;Verbal cues;Handout   Comprehension Verbalized understanding;Returned demonstration;Tactile cues required;Verbal cues required             PT Long Term Goals - 12/21/14 2228    PT LONG TERM GOAL #1   Title Pt will decrease her score on Prolapse Questionnaire POP-Q 6  from 29% to 25% in order to improve QOL.    Time 12   Period Weeks   Status On-going   PT LONG TERM GOAL #2   Title Pt will be able to lower into  carseat without lowered pelvic feeling across 3 reps in order to improve QOL.    Time 12   Period Weeks   Status On-going   PT LONG TERM GOAL #3   Title Pt report not feeling an urge to urinate when descending a hill in order to demo improved pelvic organ suuport for hiking with family.    Time 12   Period Weeks   Status On-going   PT LONG TERM GOAL #4   Title Pt will demonstrates ways to modify gardening activities to minimize relapse of symptoms with decreased pressure on pelvic floor muscles.     Time 12   Period Weeks   Status On-going   PT LONG TERM GOAL #5   Title Pt will be able to demo floor to standing 3 reps without report of a lowering pelvic feeling in order to gardening.    Time 12   Period Weeks   Status On-going               Plan - 12/28/14 2129    Clinical Impression Statement Pt has demo'd no need for a pillow under hips and demo more normal  bladder positioning.  Pt has progressed to 7 reps, 10 sec holds with Grade 4 strength (full circumferential contraction with lift).  Addressed car seat and work station modifications and advanced pt to dynamic stabilization 1-2. Decreased pt's visits to 2x month in order to allow pt to practice HEP as pt shows good improvement with decreased mm tensions and good carry over with neuromuscular training.     Pt will benefit from skilled therapeutic intervention in order to improve on the following deficits Abnormal gait;Decreased coordination;Difficulty walking;Decreased range of motion;Increased muscle spasms;Decreased safety awareness;Decreased endurance;Decreased activity tolerance;Hypomobility;Decreased scar mobility;Decreased balance;Decreased strength;Decreased mobility;Postural dysfunction;Improper body mechanics;Impaired flexibility   Rehab Potential Good   PT Frequency 1x / week   PT Duration 12 weeks   PT Treatment/Interventions ADLs/Self Care Home Management;Aquatic Therapy;Biofeedback;Moist Heat;Therapeutic activities;Dry needling;Energy conservation;Therapeutic exercise;Balance training;Neuromuscular re-education;Patient/family education;Functional mobility training;Stair training;Gait training;Cryotherapy;Passive range of motion;Scar mobilization;Compression bandaging;Manual techniques;Taping;Electrical Stimulation;Traction   PT Next Visit Plan thoracic release   Consulted and Agree with Plan of Care Patient        Problem List There are no active problems to display for this patient.   Jerl Mina ,PT, DPT, E-RYT  12/28/2014, 9:43 PM  [place] MAIN [facility] SERVICES [address] [place], [place], [address] Phone: [phone]   Fax:  [phone]  Name: Janice Wilkinson MRN: [id number] Date of Birth: 30-Sep-1950

## 2014-12-28 NOTE — Patient Instructions (Addendum)
Pelvic floor 6 reps 10 sec holds , 3 x day  You are now ready to begin training the deep core muscles system: diaphragm, transverse abdominis, pelvic floor . These muscles must work together as a team.           The key to these exercises to train the brain to coordinate the timing of these muscles and to have them turn on for long periods of time to hold you upright against gravity (especially important if you are on your feet all day).These muscles are postural muscles and play a role stabilizing your spine and bodyweight. By doing these repetitions slowly and correctly instead of doing crunches, you will achieve a flatter belly without a lower pooch. You are also placing your spine in a more neutral position and breathing properly which in turn, decreases your risk for problems related to your pelvic floor, abdominal, and low back such as pelvic organ prolapse, hernias, diastasis recti (separation of superficial muscles), disk herniations, spinal fractures. These exercises set a solid foundation for you to later progress to resistance/ strength training with therabands and weights and return to other typical fitness exercises with a stronger deeper core.    Do only Dynamic Stabilization 1-2 for the next two weeks

## 2015-01-02 ENCOUNTER — Ambulatory Visit: Payer: Managed Care, Other (non HMO) | Admitting: Physical Therapy

## 2015-01-03 ENCOUNTER — Encounter: Payer: Managed Care, Other (non HMO) | Admitting: Physical Therapy

## 2015-01-10 ENCOUNTER — Ambulatory Visit: Payer: Managed Care, Other (non HMO) | Admitting: Physical Therapy

## 2015-01-10 DIAGNOSIS — N8189 Other female genital prolapse: Secondary | ICD-10-CM

## 2015-01-10 DIAGNOSIS — R279 Unspecified lack of coordination: Secondary | ICD-10-CM

## 2015-01-10 DIAGNOSIS — M629 Disorder of muscle, unspecified: Secondary | ICD-10-CM

## 2015-01-11 NOTE — Therapy (Addendum)
[place] MAIN [facility] SERVICES [address] [place], [place], [address] Phone: [phone]   Fax:  [phone]  Physical Therapy Treatment  Patient Details  Name: Janice Wilkinson MRN: [id number] Date of Birth: 1951-01-20 Referring Provider: Leafy Ro  Encounter Date: 01/10/2015      PT End of Session - 01/23/15 2151    Visit Number 5   Number of Visits 12   Date for PT Re-Evaluation 02/19/15   Activity Tolerance Patient tolerated treatment well   Behavior During Therapy [facility] for tasks assessed/performed      Past Medical History  Diagnosis Date  . Thyroid disease     hypothyorid    Past Surgical History  Procedure Laterality Date  . No past surgeries      There were no vitals filed for this visit.  Visit Diagnosis:  Lack of coordination  Pelvic floor weakness  Fascial defect      Subjective Assessment - 01/23/15 2151    Subjective Pt reported her mm feel sore from correcting to upright posture. Pt reports she "doesn't feel the hanging senation as much" except with more strenuous activities over the weekend. Pt no longer feels the urgency and frequency  to urinate for the past weeks.     Pertinent History walking M-F daily 20-30 min, Hx vaginal birth  (> 9 lbs) and with vacuum and forceps, Regular bowel movements with Type 4-5.    Patient Stated Goals be able to lower into carchair  without lowered pelvic  feeling , and the lift pelvic organ up            [facility] PT Assessment - 01/23/15 2147    Posture/Postural Control   Posture Comments improved shoulder retraction , more upright posture                   Pelvic Floor Special Questions - 01/23/15 2147    Prolapse other standing: lowered bladder position, patient unable to reposition with pelvic floor contraction   Pelvic Floor Internal Exam pt verbally consented without contraindications   Exam Type Vaginal   Strength good squeeze, good lift, able to hold  agaisnt strong resistance  without pillow,  with more cephalad position of bladder      Strength # of reps 8   Strength # of seconds 10           [facility] Adult PT Treatment/Exercise - 01/23/15 2147    Therapeutic Activites    Other Therapeutic Activities exhalation with against gravity activities   Neuro Re-ed    Neuro Re-ed Details   modified pilated with red band 10 reps   dynamic stabilization level 3-4 10 reps                     PT Long Term Goals - 01/10/15 1722    PT LONG TERM GOAL #1   Title (p) Pt will decrease her score on Prolapse Questionnaire POP-Q 6  from 29% to 25% in order to improve QOL.    Time (p) 12   Period (p) Weeks   Status (p) On-going   PT LONG TERM GOAL #2   Title (p) Pt will be able to lower into carseat without lowered pelvic feeling across 3 reps in order to improve QOL.    Time (p) 12   Period (p) Weeks   Status (p) Partially Met   PT LONG TERM GOAL #3   Title (p) Pt report not feeling  an urge to urinate when descending a hill in order to demo improved pelvic organ suuport for hiking with family.    Time (p) 12   Period (p) Weeks   Status (p) Achieved   PT LONG TERM GOAL #4   Title (p) Pt will demonstrates ways to modify gardening activities to minimize relapse of symptoms with decreased pressure on pelvic floor muscles.     Time (p) 12   Period (p) Weeks   Status (p) On-going   PT LONG TERM GOAL #5   Title (p) Pt will be able to demo floor to standing 3 reps without report of a lowering pelvic feeling in order to gardening.    Time (p) 12   Period (p) Weeks   Status (p) On-going               Plan - 01/23/15 2151    Clinical Impression Statement Pt is progressing well with dynamic stabilization exercises. Added bridging exercises with theraband exercises to strengthen posterior back and deep core muscles in gravity eliminated position to continue stengthening pelvic floor to minimize worsening of prolapse. Pt showed  improved bladder position in hooklying but did not show strong enough pelvic floor contractions to maintain bladder in more caudal position in standing. Pt will continue to benefit from skilled PT to achieve her goals. Decreased sessions to every 2 weeks in order to allow pt to maintain strengthening exercises before the next progression.   Pt will benefit from skilled therapeutic intervention in order to improve on the following deficits Abnormal gait;Decreased coordination;Difficulty walking;Decreased range of motion;Increased muscle spasms;Decreased safety awareness;Decreased endurance;Decreased activity tolerance;Hypomobility;Decreased scar mobility;Decreased balance;Decreased strength;Decreased mobility;Postural dysfunction;Improper body mechanics;Impaired flexibility   Rehab Potential Good   PT Frequency 1x / week   PT Duration 12 weeks   PT Treatment/Interventions ADLs/Self Care Home Management;Aquatic Therapy;Biofeedback;Moist Heat;Therapeutic activities;Dry needling;Energy conservation;Therapeutic exercise;Balance training;Neuromuscular re-education;Patient/family education;Functional mobility training;Stair training;Gait training;Cryotherapy;Passive range of motion;Scar mobilization;Compression bandaging;Manual techniques;Taping;Electrical Stimulation;Traction   PT Next Visit Plan thoracic release   Consulted and Agree with Plan of Care Patient        Problem List There are no active problems to display for this patient.   Jerl Mina ,PT, DPT, E-RYT  01/23/2015, 9:55 PM  [place] MAIN [facility] SERVICES [address] [place], [place], [address] Phone: [phone]   Fax:  [phone]  Name: Janice Wilkinson MRN: [id number] Date of Birth: [birth year]

## 2015-01-22 ENCOUNTER — Encounter: Payer: Managed Care, Other (non HMO) | Admitting: Physical Therapy

## 2015-02-07 ENCOUNTER — Ambulatory Visit: Payer: Managed Care, Other (non HMO) | Attending: Obstetrics and Gynecology | Admitting: Physical Therapy

## 2015-02-07 DIAGNOSIS — N8189 Other female genital prolapse: Secondary | ICD-10-CM | POA: Insufficient documentation

## 2015-02-07 DIAGNOSIS — M629 Disorder of muscle, unspecified: Secondary | ICD-10-CM | POA: Insufficient documentation

## 2015-02-07 DIAGNOSIS — R279 Unspecified lack of coordination: Secondary | ICD-10-CM | POA: Diagnosis present

## 2015-02-07 NOTE — Patient Instructions (Addendum)
Sidelying hip circles clock wise 5 reps, counter clockwise 5x  Sidelying hip abduction (raise leg up ~30 deg (slight behind your buttock) 5x   Build up to 10x the following week  ____________________  Seated long holds 5 sec, 2 breaths rest  At 5 different times a day, totalling to 25 reps /day,   2 weeks   continue with 10 reps of 10 sec holds once day  ___________________  paloff press (stand perpendicular to the door, hold double red bands like an accordian) and feet wide, slight knee bent, pull bands to opposite side from door about 15-20 deg without moving hips)  10x 3    Apply the exhale with sidestepping when sweeping. Mopping. vacuuming    Read over the handout on lifting with prolapse

## 2015-02-08 NOTE — Therapy (Signed)
[place] MAIN [facility] SERVICES [address] [place], [place], [address] Phone: [phone]   Fax:  [phone]  Physical Therapy Treatment/ Progress Note  Patient Details  Name: Janice Wilkinson MRN: [id number] Date of Birth: 1950/09/08 Referring Provider: Leafy Ro  Encounter Date: 02/07/2015      PT End of Session - 02/11/15 0852    Visit Number 7   Number of Visits 12   Date for PT Re-Evaluation 02/19/15   PT Start Time [id number]   PT Stop Time 1815   PT Time Calculation (min) 60 min   Activity Tolerance Patient tolerated treatment well   Behavior During Therapy [facility] for tasks assessed/performed      Past Medical History  Diagnosis Date  . Thyroid disease     hypothyorid    Past Surgical History  Procedure Laterality Date  . No past surgeries      There were no vitals filed for this visit.  Visit Diagnosis:  Pelvic floor weakness  Fascial defect  Lack of coordination      Subjective Assessment - 02/11/15 0848    Subjective Pt reported she feels her pressure sensation when sitting has improved and she now only noticing it 25% of the time instead of all the time. Pt notices now that the hanging sensation occurs mostly with activities on the weekend with household chores: vaccuming, laudry, up and down stairs, changing linens, mopping floors. She notices the hanging sesnsation more when bending and picking up objects.    Pertinent History walking M-F daily 20-30 min, Hx vaginal birth  (> 9 lbs) and with vacuum and forceps, Regular bowel movements with Type 4-5.    Patient Stated Goals be able to lower into carchair  without lowered pelvic  feeling , and the lift pelvic organ up            [facility] PT Assessment - 02/11/15 0850    Observation/Other Assessments   Observations improved upright posture                   Pelvic Floor Special Questions - 02/11/15 0849    Prolapse other standing: bladder in more cephalad  position, patient able to reposition with pelvic floor contraction slightly outside introitus   3 mini squats, able to coordinate   Pelvic Floor Internal Exam pt verbally consented without contraindications   Exam Type Vaginal   Strength good squeeze, good lift, able to hold agaisnt strong resistance  without pillow,  with more cephalad position of bladder      Strength # of reps 8   Strength # of seconds 10           [facility] Adult PT Treatment/Exercise - 02/11/15 0851    Neuro Re-ed    Neuro Re-ed Details  alignment and tactile cues with new HEP   coordinating quick pelvic floor contraction w/ 3 mini squats   Exercises   Exercises Lumbar;Knee/Hip   Other Exercises  see pt instructions                PT Education - 02/11/15 0852    Education provided Yes   Education Details HEP   Person(s) Educated Patient   Methods Explanation;Demonstration;Tactile cues;Verbal cues;Handout   Comprehension Verbalized understanding;Returned demonstration             PT Long Term Goals - 02/11/15 0853    PT LONG TERM GOAL #1   Title Pt will decrease her score on Prolapse Questionnaire  POP-Q 6  from 29% to 25% in order to improve QOL. (12/15: 25%)    Time 12   Period Weeks   Status On-going   PT LONG TERM GOAL #2   Title Pt will be able to lower into carseat without lowered pelvic feeling across 3 reps in order to improve QOL.    Time 12   Period Weeks   Status Achieved   PT LONG TERM GOAL #3   Title Pt report not feeling an urge to urinate when descending a hill in order to demo improved pelvic organ suuport for hiking with family.    Time 12   Period Weeks   Status Achieved   PT LONG TERM GOAL #4   Title Pt will demonstrates ways to modify gardening activities to minimize relapse of symptoms with decreased pressure on pelvic floor muscles.     Time 12   Period Weeks   Status On-going   PT LONG TERM GOAL #5   Title Pt will be able to demo floor to standing 3 reps without  report of a lowering pelvic feeling in order to gardening.    Time 12   Period Weeks   Status On-going   PT LONG TERM GOAL #6   Title Pt will demo a lfit of bladder into position inside introitus in standing on exhale and with squat to rise across 3 reps in order to show improved fascial tensigrity and pelvic floor strength to perform gardening activities.    Time 12   Period Weeks   Status New               Plan - 02/11/15 0853    Clinical Impression Statement Pt is achieved 2/6 goals  and is progressing well towards her remaining goals. Her pelvic floor strength and coordination has improved significantly and is starting to show improved ability to lift her bladder into a more cephalad position in upright position such as standing and mini squats. Pt was educated on progression of exercises and application of deep core coordination in household activities such as sweeping, vacuuming. Pt demo'd technique correctly along with proper body mechanics.  Pt will benefit from continued skilled PT to advance towards functional strengthening in upright positions in order to improve prolapse Sx with gardening and household activities.      Pt will benefit from skilled therapeutic intervention in order to improve on the following deficits Abnormal gait;Decreased coordination;Difficulty walking;Decreased range of motion;Increased muscle spasms;Decreased safety awareness;Decreased endurance;Decreased activity tolerance;Hypomobility;Decreased scar mobility;Decreased balance;Decreased strength;Decreased mobility;Postural dysfunction;Improper body mechanics;Impaired flexibility   Rehab Potential Good   PT Frequency 1x / month   PT Duration 16 weeks   PT Treatment/Interventions ADLs/Self Care Home Management;Aquatic Therapy;Biofeedback;Moist Heat;Therapeutic activities;Dry needling;Energy conservation;Therapeutic exercise;Balance training;Neuromuscular re-education;Patient/family education;Functional  mobility training;Stair training;Gait training;Cryotherapy;Passive range of motion;Scar mobilization;Compression bandaging;Manual techniques;Taping;Electrical Stimulation;Traction   PT Next Visit Plan thoracic release   Consulted and Agree with Plan of Care Patient        Problem List There are no active problems to display for this patient.   Jerl Mina ,PT, DPT, E-RYT  02/11/2015, 9:00 AM  [place] MAIN [facility] SERVICES [address] [place], [place], [address] Phone: [phone]   Fax:  [phone]  Name: Janice Wilkinson MRN: [id number] Date of Birth: [birth year]

## 2015-02-11 ENCOUNTER — Ambulatory Visit: Payer: Managed Care, Other (non HMO) | Admitting: Physical Therapy

## 2015-02-11 DIAGNOSIS — N8189 Other female genital prolapse: Secondary | ICD-10-CM | POA: Diagnosis not present

## 2015-02-11 DIAGNOSIS — M629 Disorder of muscle, unspecified: Secondary | ICD-10-CM

## 2015-02-11 DIAGNOSIS — R279 Unspecified lack of coordination: Secondary | ICD-10-CM

## 2015-02-12 NOTE — Therapy (Signed)
[place] MAIN [facility] SERVICES [address] [place], [place], [address] Phone: [phone]   Fax:  [phone]  Physical Therapy Treatment  Patient Details  Name: KATIELYNN DELOZA MRN: [id number] Date of Birth: 01-Nov-1950 Referring Provider: Leafy Ro  Encounter Date: 02/11/2015      PT End of Session - 02/11/15 0852    Visit Number 7   Number of Visits 12   Date for PT Re-Evaluation 02/19/15   PT Start Time [id number]   PT Stop Time 1815   PT Time Calculation (min) 60 min   Activity Tolerance Patient tolerated treatment well   Behavior During Therapy [facility] for tasks assessed/performed      Past Medical History  Diagnosis Date  . Thyroid disease     hypothyorid    Past Surgical History  Procedure Laterality Date  . No past surgeries      There were no vitals filed for this visit.  Visit Diagnosis:  Pelvic floor weakness  Fascial defect  Lack of coordination      Subjective Assessment - 02/12/15 1109    Subjective Pt reported she tried to perform her housework but had to think about coordinating the pelvic floor contractions.     Pertinent History walking M-F daily 20-30 min, Hx vaginal birth  (> 9 lbs) and with vacuum and forceps, Regular bowel movements with Type 4-5.    Patient Stated Goals be able to lower into carchair  without lowered pelvic  feeling , and the lift pelvic organ up            [facility] PT Assessment - 02/11/15 0850    Observation/Other Assessments   Observations improved upright posture                   Pelvic Floor Special Questions - 02/11/15 0849    Prolapse other standing: bladder in more cephalad position, patient able to reposition with pelvic floor contraction slightly outside introitus   3 mini squats, able to coordinate   Pelvic Floor Internal Exam pt verbally consented without contraindications   Exam Type Vaginal   Strength good squeeze, good lift, able to hold agaisnt strong  resistance  without pillow,  with more cephalad position of bladder      Strength # of reps 8   Strength # of seconds 10           [facility] Adult PT Treatment/Exercise - 02/12/15 1115    Exercises   Other Exercises  ROM exercises for neck, shoulders, hips, legs, prior to abdominal massage    Manual Therapy   Manual therapy comments abdominal massage, guided pt on self massage                 PT Education - 02/12/15 1116    Education provided Yes   Education Details HEP   Person(s) Educated Patient   Methods Explanation;Demonstration;Verbal cues;Handout   Comprehension Verbalized understanding;Returned demonstration             PT Long Term Goals - 02/11/15 0853    PT LONG TERM GOAL #1   Title Pt will decrease her score on Prolapse Questionnaire POP-Q 6  from 29% to 25% in order to improve QOL. (12/15: 25%)    Time 12   Period Weeks   Status On-going   PT LONG TERM GOAL #2   Title Pt will be able to lower into carseat without lowered pelvic feeling across 3 reps in order to improve  QOL.    Time 12   Period Weeks   Status Achieved   PT LONG TERM GOAL #3   Title Pt report not feeling an urge to urinate when descending a hill in order to demo improved pelvic organ suuport for hiking with family.    Time 12   Period Weeks   Status Achieved   PT LONG TERM GOAL #4   Title Pt will demonstrates ways to modify gardening activities to minimize relapse of symptoms with decreased pressure on pelvic floor muscles.     Time 12   Period Weeks   Status On-going   PT LONG TERM GOAL #5   Title Pt will be able to demo floor to standing 3 reps without report of a lowering pelvic feeling in order to gardening.    Time 12   Period Weeks   Status On-going   PT LONG TERM GOAL #6   Title Pt will demo a lfit of bladder into position inside introitus in standing on exhale and with squat to rise across 3 reps in order to show improved fascial tensigrity and pelvic floor strength to  perform gardening activities.    Time 12   Period Weeks   Status New               Plan - 02/12/15 1119    Clinical Impression Statement Pt demo'd abdominal massage correctly which was incorporated into her HEP with deep core strengthening. Anticipate massage will help increase abdominal fascial tensigrity in order to minimize downward forces on pelvic floor and to optimize the function of the deep core system in upright functional strengthening exercises. Pt will continue to benefit from skilled PT to achieve her remaining goals.    Pt will benefit from skilled therapeutic intervention in order to improve on the following deficits Abnormal gait;Decreased coordination;Difficulty walking;Decreased range of motion;Increased muscle spasms;Decreased safety awareness;Decreased endurance;Decreased activity tolerance;Hypomobility;Decreased scar mobility;Decreased balance;Decreased strength;Decreased mobility;Postural dysfunction;Improper body mechanics;Impaired flexibility   Rehab Potential Good   PT Frequency Monthy   PT Duration Other (comment)  16 weeks   PT Treatment/Interventions ADLs/Self Care Home Management;Aquatic Therapy;Biofeedback;Moist Heat;Therapeutic activities;Dry needling;Energy conservation;Therapeutic exercise;Balance training;Neuromuscular re-education;Patient/family education;Functional mobility training;Stair training;Gait training;Cryotherapy;Passive range of motion;Scar mobilization;Compression bandaging;Manual techniques;Taping;Electrical Stimulation;Traction   PT Next Visit Plan thoracic release   Consulted and Agree with Plan of Care Patient        Problem List There are no active problems to display for this patient.   Jerl Mina ,PT, DPT, E-RYT  02/12/2015, 11:29 AM  [place] MAIN [facility] SERVICES [address] [place], [place], [address] Phone: [phone]   Fax:  [phone]  Name: KEVIANA BALSAM MRN:  [id number] Date of Birth: [birth year]

## 2015-02-12 NOTE — Patient Instructions (Signed)
Handout on abdominal massage and ROM exercises

## 2015-02-24 DIAGNOSIS — M810 Age-related osteoporosis without current pathological fracture: Secondary | ICD-10-CM

## 2015-02-24 HISTORY — DX: Age-related osteoporosis without current pathological fracture: M81.0

## 2015-03-21 ENCOUNTER — Ambulatory Visit: Payer: Managed Care, Other (non HMO) | Attending: Obstetrics and Gynecology | Admitting: Physical Therapy

## 2015-03-21 ENCOUNTER — Encounter: Payer: Self-pay | Admitting: Physical Therapy

## 2015-03-21 DIAGNOSIS — M629 Disorder of muscle, unspecified: Secondary | ICD-10-CM | POA: Insufficient documentation

## 2015-03-21 DIAGNOSIS — R279 Unspecified lack of coordination: Secondary | ICD-10-CM | POA: Diagnosis present

## 2015-03-21 DIAGNOSIS — N8189 Other female genital prolapse: Secondary | ICD-10-CM | POA: Insufficient documentation

## 2015-03-21 NOTE — Therapy (Signed)
[place] MAIN [facility] SERVICES [address] [place], [place], [address] Phone: [phone]   Fax:  [phone]  Physical Therapy Treatment  Patient Details  Name: Janice Wilkinson MRN: [id number] Date of Birth: 07/04/1950 Referring Provider: Leafy Ro  Encounter Date: 03/21/2015      PT End of Session - 03/21/15 2333    Visit Number 8   Number of Visits 12   Date for PT Re-Evaluation 05/06/15   PT Start Time 1710   PT Stop Time 1800   PT Time Calculation (min) 50 min   Activity Tolerance Patient tolerated treatment well   Behavior During Therapy [facility] for tasks assessed/performed      Past Medical History  Diagnosis Date  . Thyroid disease     hypothyorid    Past Surgical History  Procedure Laterality Date  . No past surgeries      There were no vitals filed for this visit.  Visit Diagnosis:  Pelvic floor weakness  Fascial defect  Lack of coordination      Subjective Assessment - 03/21/15 1714    Subjective Pt reports she only notices her prolapse Sx with sitting but with household chores. Pt does feel "a little bit" of the Prolapse Sx with reaching for laundry all the way on the floor.     Pertinent History walking M-F daily 20-30 min, Hx vaginal birth  (> 9 lbs) and with vacuum and forceps, Regular bowel movements with Type 4-5.    Patient Stated Goals be able to lower into carchair  without lowered pelvic  feeling , and the lift pelvic organ up            [facility] PT Assessment - 03/21/15 2331    Observation/Other Assessments   Observations demo'd good dissassociation of lumbar spine from pelvis    Floor to Stand   Comments poor alignment noted ( low squat), minor cues with golfer's lift and low lunger (toes tucked )                   Pelvic Floor Special Questions - 03/21/15 2328    Prolapse other standing: noted increase strength in lifting bladder into more cephaled position without squats, only in  static standing position   lifting of bladder noted w/ static standing w/ exhalation   Pelvic Floor Internal Exam pt verbally consented without contraindications   Exam Type Vaginal   Strength strong squeeze, against strong resistance   Strength # of reps --   Strength # of seconds --           [facility] Adult PT Treatment/Exercise - 03/21/15 2331    Therapeutic Activites    Other Therapeutic Activities strategies with gardening to limit repeated floor to rise (see pt instructions)    Neuro Re-ed    Neuro Re-ed Details  cues for proper body mechanics with floor to rise   Exercises   Other Exercises  progressed exercises (see pt instructions)                     PT Long Term Goals - 03/21/15 1716    PT LONG TERM GOAL #1   Title Pt will decrease her score on Prolapse Questionnaire POP-Q 6  from 29% to 25% in order to improve QOL. (12/15: 25%)    Time 12   Period Weeks   Status Achieved   PT LONG TERM GOAL #2   Title Pt will be able to lower  into carseat without lowered pelvic feeling across 3 reps in order to improve QOL.    Time 12   Period Weeks   Status Achieved   PT LONG TERM GOAL #3   Title Pt report not feeling an urge to urinate when descending a hill in order to demo improved pelvic organ suuport for hiking with family.    Time 12   Period Weeks   Status Achieved   PT LONG TERM GOAL #4   Title Pt will demonstrates ways to modify gardening activities to minimize relapse of symptoms with decreased pressure on pelvic floor muscles.     Time 12   Period Weeks   Status On-going   PT LONG TERM GOAL #5   Title Pt will be able to demo floor to standing 3 reps without report of a lowering pelvic feeling in order to gardening.    Time 12   Period Weeks   Status Achieved   PT LONG TERM GOAL #6   Title Pt will demo a lfit of bladder into position inside introitus in standing on exhale and with squat to rise across 3 reps in order to show improved fascial tensigrity  and pelvic floor strength to perform gardening activities.    Time 12   Period Weeks   Status Achieved               Plan - 03/21/15 2334    Clinical Impression Statement Pt demo'd increased pelvic floor strength with ability to performing a stronger lift of bladder in static standing position compared to last session. Pt also showed improved dynamic core stability and was able to advance to more resistive band exercises in upright positions.Discussed/demo'd with pt body mechanics to minimize strain on pelvic floor with gardening activities but still requires more training. Pt demo'd correctly and voiced understanding. Pt expressed interest in a resistive band wellness routine and a programs targeted for osteoporosis which she reported as a new Dx.Anticipate pt will achieve all of goals at planned d/c for the next visit.   Pt will benefit from skilled therapeutic intervention in order to improve on the following deficits Abnormal gait;Decreased coordination;Difficulty walking;Decreased range of motion;Increased muscle spasms;Decreased safety awareness;Decreased endurance;Decreased activity tolerance;Hypomobility;Decreased scar mobility;Decreased balance;Decreased strength;Decreased mobility;Postural dysfunction;Improper body mechanics;Impaired flexibility   Rehab Potential Good   PT Frequency Monthy   PT Duration Other (comment)        Problem List There are no active problems to display for this patient.   Jerl Mina 03/21/2015, 11:38 PM  [place] MAIN [facility] SERVICES [address] [place], [place], [address] Phone: [phone]   Fax:  [phone]  Name: Janice Wilkinson MRN: [id number] Date of Birth: 1950/04/30

## 2015-03-21 NOTE — Patient Instructions (Addendum)
front band exercise on door knob --elbows straight 10x 2  "Pulling the lawn mower"   10x 2         Sidestep squats-  Red band on thighs  And red band pulling apart by hands       Monster walk hallway lap 2x       Fire hydrant  (fist) 10x        Work ex:   Against the wall:  Extended side angle  With arm by on exhale       ------------------------------------  Gardening: limit ground to rise repetitions  By placing small pile of sticks on tarp, pour into wheel barrow,  Then pick from higher ground  to trash can   Buying a reacher to pick small sticks   Use golfer's lift or low lunge to pick up objects   _______________________  Lowering into chair at work:  Bring a thick pillow and place into chair every time before sitting, then remove

## 2015-04-16 ENCOUNTER — Ambulatory Visit: Payer: Managed Care, Other (non HMO) | Attending: Obstetrics and Gynecology | Admitting: Physical Therapy

## 2015-04-16 DIAGNOSIS — R279 Unspecified lack of coordination: Secondary | ICD-10-CM | POA: Insufficient documentation

## 2015-04-16 DIAGNOSIS — N8189 Other female genital prolapse: Secondary | ICD-10-CM

## 2015-04-16 DIAGNOSIS — M629 Disorder of muscle, unspecified: Secondary | ICD-10-CM | POA: Insufficient documentation

## 2015-04-16 NOTE — Therapy (Signed)
[place] MAIN [facility] SERVICES [address] [place], [place], [address] Phone: [phone]   Fax:  [phone]  Physical Therapy Discharge Summary  Patient Details  Name: Janice Wilkinson MRN: [id number] Date of Birth: [birth year] Referring Provider: Leafy Ro  Encounter Date: 04/16/2015      PT End of Session - 04/16/15 1705    Visit Number --   Date for PT Re-Evaluation 05/06/15      Past Medical History  Diagnosis Date  . Thyroid disease     hypothyorid  . Osteoporosis 2017    Past Surgical History  Procedure Laterality Date  . No past surgeries      There were no vitals filed for this visit.  Visit Diagnosis:  Pelvic floor weakness  Fascial defect  Lack of coordination      Subjective Assessment - 04/16/15 1702    Subjective Pt reported she was able to modify her gardening activities and did not notice any prolapse related symptoms. Pt is ready for d/c and is interested in a wellness program using resistance bands.     Pertinent History walking M-F daily 20-30 min, Hx vaginal birth  (> 9 lbs) and with vacuum and forceps, Regular bowel movements with Type 4-5.    Patient Stated Goals be able to lower into carchair  without lowered pelvic  feeling , and the lift pelvic organ up                 PT Education - 04/16/15 1704    Education provided Yes   Education Details d/c and resource on Handibands ([email or web address])   Person(s) Educated Patient   Methods Explanation             PT Long Term Goals - 04/16/15 1700    PT LONG TERM GOAL #1   Title Pt will decrease her score on Prolapse Questionnaire POP-Q 6  from 29% to 25% in order to improve QOL. (12/15: 25%)    Time 12   Period Weeks   Status Achieved   PT LONG TERM GOAL #2   Title Pt will be able to lower into carseat without lowered pelvic feeling across 3 reps in order to improve QOL.    Time 12   Period Weeks   Status Achieved   PT LONG  TERM GOAL #3   Title Pt report not feeling an urge to urinate when descending a hill in order to demo improved pelvic organ suuport for hiking with family.    Time 12   Period Weeks   Status Achieved   PT LONG TERM GOAL #4   Title Pt will demonstrates ways to modify gardening activities to minimize relapse of symptoms with decreased pressure on pelvic floor muscles.     Time 12   Period Weeks   Status Achieved   PT LONG TERM GOAL #5   Title Pt will be able to demo floor to standing 3 reps without report of a lowering pelvic feeling in order to gardening.    Time 12   Period Weeks   Status Achieved   PT LONG TERM GOAL #6   Title Pt will demo a lfit of bladder into position inside introitus in standing on exhale and with squat to rise across 3 reps in order to show improved fascial tensigrity and pelvic floor strength to perform gardening activities.    Time 12   Period Weeks   Status Achieved  Plan - 04/16/15 1707    Clinical Impression Statement Across 8 sessions over 4 months, pt has achieved 100% of her goals. Pt reports feeling signifcantly decreased prolpase sensations with the following activities: sitting, standing, walking, gardening, and car transfers. Pt does not have the urge to urinate with down hill activities.  Pt reports her Sx has improved "Quite a bit better" based on the [facility]. Pt has demonstrated significantly improved pelvic floor coordination and strength and increased fascial tensigrity with a more caudal positioning of her bladder in hooklying and standing position. Pt demonstrates improved upright posture and a better understanding of body mechanic with lifting and ADLs which will help her maintaing the progress she has made.  Pt was provided a resource website to continue using resistance band exercises as she enjoyed this mode of fitness. Pt is ready for d/c.     Pt will benefit from skilled therapeutic intervention in order to improve on the  following deficits Abnormal gait;Decreased coordination;Difficulty walking;Decreased range of motion;Increased muscle spasms;Decreased safety awareness;Decreased endurance;Decreased activity tolerance;Hypomobility;Decreased scar mobility;Decreased balance;Decreased strength;Decreased mobility;Postural dysfunction;Improper body mechanics;Impaired flexibility   Rehab Potential Good   PT Frequency Monthy   PT Duration Other (comment)        Problem List There are no active problems to display for this patient.   Jerl Mina ,PT, DPT, E-RYT  04/16/2015, 5:20 PM  [place] MAIN [facility] SERVICES [address] [place], [place], [address] Phone: [phone]   Fax:  [phone]  Name: Janice Wilkinson MRN: [id number] Date of Birth: Jan 21, 1951

## 2015-05-14 ENCOUNTER — Other Ambulatory Visit: Payer: Self-pay | Admitting: Obstetrics and Gynecology

## 2015-05-14 DIAGNOSIS — Z1231 Encounter for screening mammogram for malignant neoplasm of breast: Secondary | ICD-10-CM

## 2015-05-30 ENCOUNTER — Ambulatory Visit
Admission: RE | Admit: 2015-05-30 | Discharge: 2015-05-30 | Disposition: A | Discharge status: Home / Self Care | Payer: Managed Care, Other (non HMO) | Source: Ambulatory Visit | Attending: Obstetrics and Gynecology | Admitting: Obstetrics and Gynecology

## 2015-05-30 DIAGNOSIS — Z1231 Encounter for screening mammogram for malignant neoplasm of breast: Secondary | ICD-10-CM

## 2015-05-30 HISTORY — DX: Malignant (primary) neoplasm, unspecified: C80.1

## 2016-05-14 ENCOUNTER — Other Ambulatory Visit: Payer: Self-pay | Admitting: Obstetrics and Gynecology

## 2016-05-14 DIAGNOSIS — Z1231 Encounter for screening mammogram for malignant neoplasm of breast: Secondary | ICD-10-CM

## 2016-06-02 ENCOUNTER — Ambulatory Visit
Admission: RE | Admit: 2016-06-02 | Discharge: 2016-06-02 | Disposition: A | Discharge status: Home / Self Care | Payer: 59 | Source: Ambulatory Visit | Attending: Obstetrics and Gynecology | Admitting: Obstetrics and Gynecology

## 2016-06-02 DIAGNOSIS — Z1231 Encounter for screening mammogram for malignant neoplasm of breast: Secondary | ICD-10-CM | POA: Diagnosis not present

## 2016-11-09 ENCOUNTER — Other Ambulatory Visit: Payer: Self-pay | Admitting: Family Medicine

## 2016-11-09 DIAGNOSIS — M5410 Radiculopathy, site unspecified: Secondary | ICD-10-CM

## 2016-11-13 ENCOUNTER — Ambulatory Visit
Admission: RE | Admit: 2016-11-13 | Discharge: 2016-11-13 | Disposition: A | Discharge status: Home / Self Care | Payer: 59 | Source: Ambulatory Visit | Attending: Family Medicine | Admitting: Family Medicine

## 2016-11-18 ENCOUNTER — Ambulatory Visit
Admission: RE | Admit: 2016-11-18 | Discharge: 2016-11-18 | Disposition: A | Discharge status: Home / Self Care | Payer: 59 | Source: Ambulatory Visit | Attending: Family Medicine | Admitting: Family Medicine

## 2016-11-18 DIAGNOSIS — M5116 Intervertebral disc disorders with radiculopathy, lumbar region: Secondary | ICD-10-CM | POA: Insufficient documentation

## 2016-11-18 DIAGNOSIS — M48061 Spinal stenosis, lumbar region without neurogenic claudication: Secondary | ICD-10-CM | POA: Insufficient documentation

## 2016-11-18 DIAGNOSIS — M5410 Radiculopathy, site unspecified: Secondary | ICD-10-CM

## 2017-05-18 ENCOUNTER — Other Ambulatory Visit: Payer: Self-pay | Admitting: Obstetrics and Gynecology

## 2017-05-18 DIAGNOSIS — Z1231 Encounter for screening mammogram for malignant neoplasm of breast: Secondary | ICD-10-CM

## 2017-06-10 ENCOUNTER — Ambulatory Visit
Admission: RE | Admit: 2017-06-10 | Discharge: 2017-06-10 | Disposition: A | Discharge status: Home / Self Care | Payer: Medicare Other | Source: Ambulatory Visit | Attending: Obstetrics and Gynecology | Admitting: Obstetrics and Gynecology

## 2017-06-10 DIAGNOSIS — Z1231 Encounter for screening mammogram for malignant neoplasm of breast: Secondary | ICD-10-CM | POA: Diagnosis not present

## 2018-06-29 ENCOUNTER — Other Ambulatory Visit: Payer: Self-pay | Admitting: Obstetrics and Gynecology

## 2018-06-29 DIAGNOSIS — Z1231 Encounter for screening mammogram for malignant neoplasm of breast: Secondary | ICD-10-CM

## 2018-08-24 ENCOUNTER — Other Ambulatory Visit: Payer: Self-pay

## 2018-08-24 ENCOUNTER — Ambulatory Visit
Admission: RE | Admit: 2018-08-24 | Discharge: 2018-08-24 | Disposition: A | Discharge status: Home / Self Care | Payer: Medicare Other | Source: Ambulatory Visit | Attending: Obstetrics and Gynecology | Admitting: Obstetrics and Gynecology

## 2018-08-24 DIAGNOSIS — Z1231 Encounter for screening mammogram for malignant neoplasm of breast: Secondary | ICD-10-CM | POA: Diagnosis present

## 2019-07-17 ENCOUNTER — Other Ambulatory Visit: Payer: Self-pay | Admitting: Obstetrics and Gynecology

## 2019-07-17 ENCOUNTER — Other Ambulatory Visit: Payer: Self-pay | Admitting: Family Medicine

## 2019-07-17 DIAGNOSIS — Z1231 Encounter for screening mammogram for malignant neoplasm of breast: Secondary | ICD-10-CM

## 2019-08-25 ENCOUNTER — Ambulatory Visit
Admission: RE | Admit: 2019-08-25 | Discharge: 2019-08-25 | Disposition: A | Discharge status: Home / Self Care | Payer: Medicare Other | Source: Ambulatory Visit | Attending: Family Medicine | Admitting: Family Medicine

## 2019-08-25 DIAGNOSIS — Z1231 Encounter for screening mammogram for malignant neoplasm of breast: Secondary | ICD-10-CM | POA: Diagnosis present

## 2020-07-30 ENCOUNTER — Other Ambulatory Visit: Payer: Self-pay | Admitting: Family Medicine

## 2020-07-30 DIAGNOSIS — Z1231 Encounter for screening mammogram for malignant neoplasm of breast: Secondary | ICD-10-CM

## 2020-08-28 ENCOUNTER — Other Ambulatory Visit: Payer: Self-pay

## 2020-08-28 ENCOUNTER — Ambulatory Visit
Admission: RE | Admit: 2020-08-28 | Discharge: 2020-08-28 | Disposition: A | Discharge status: Home / Self Care | Payer: Medicare Other | Source: Ambulatory Visit | Attending: Family Medicine | Admitting: Family Medicine

## 2020-08-28 DIAGNOSIS — Z1231 Encounter for screening mammogram for malignant neoplasm of breast: Secondary | ICD-10-CM | POA: Diagnosis present

## 2021-03-05 ENCOUNTER — Other Ambulatory Visit: Payer: Self-pay | Admitting: Family Medicine

## 2021-03-05 DIAGNOSIS — Z8051 Family history of malignant neoplasm of kidney: Secondary | ICD-10-CM

## 2021-07-25 ENCOUNTER — Other Ambulatory Visit: Payer: Self-pay | Admitting: Family Medicine

## 2021-07-25 DIAGNOSIS — Z1231 Encounter for screening mammogram for malignant neoplasm of breast: Secondary | ICD-10-CM

## 2021-08-29 ENCOUNTER — Ambulatory Visit
Admission: RE | Admit: 2021-08-29 | Discharge: 2021-08-29 | Disposition: A | Discharge status: Home / Self Care | Payer: Medicare Other | Source: Ambulatory Visit | Attending: Family Medicine | Admitting: Family Medicine

## 2021-08-29 DIAGNOSIS — Z1231 Encounter for screening mammogram for malignant neoplasm of breast: Secondary | ICD-10-CM | POA: Diagnosis present

## 2022-02-17 IMAGING — MG MM DIGITAL SCREENING BILAT W/ TOMO AND CAD
6 of 12 series · 6 of 36 positions shown · non-contrast
Comparison: Previous exam(s).

CLINICAL DATA: Screening.

EXAM:
DIGITAL SCREENING BILATERAL MAMMOGRAM WITH TOMOSYNTHESIS AND CAD
TECHNIQUE: Bilateral screening digital craniocaudal and mediolateral oblique
mammograms were obtained. Bilateral screening digital breast
tomosynthesis was performed. The images were evaluated with
computer-aided detection.

[L MLO synth-2D (1 of 2)]
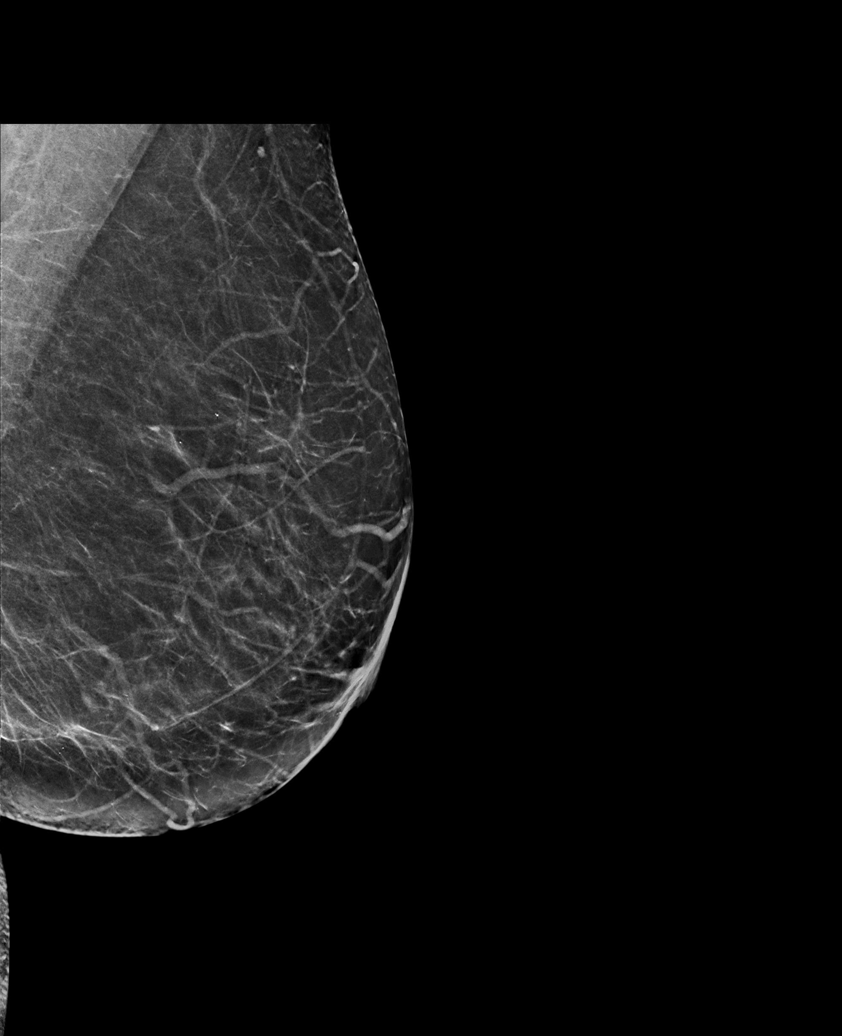

[R MLO synth-2D (1 of 2)]
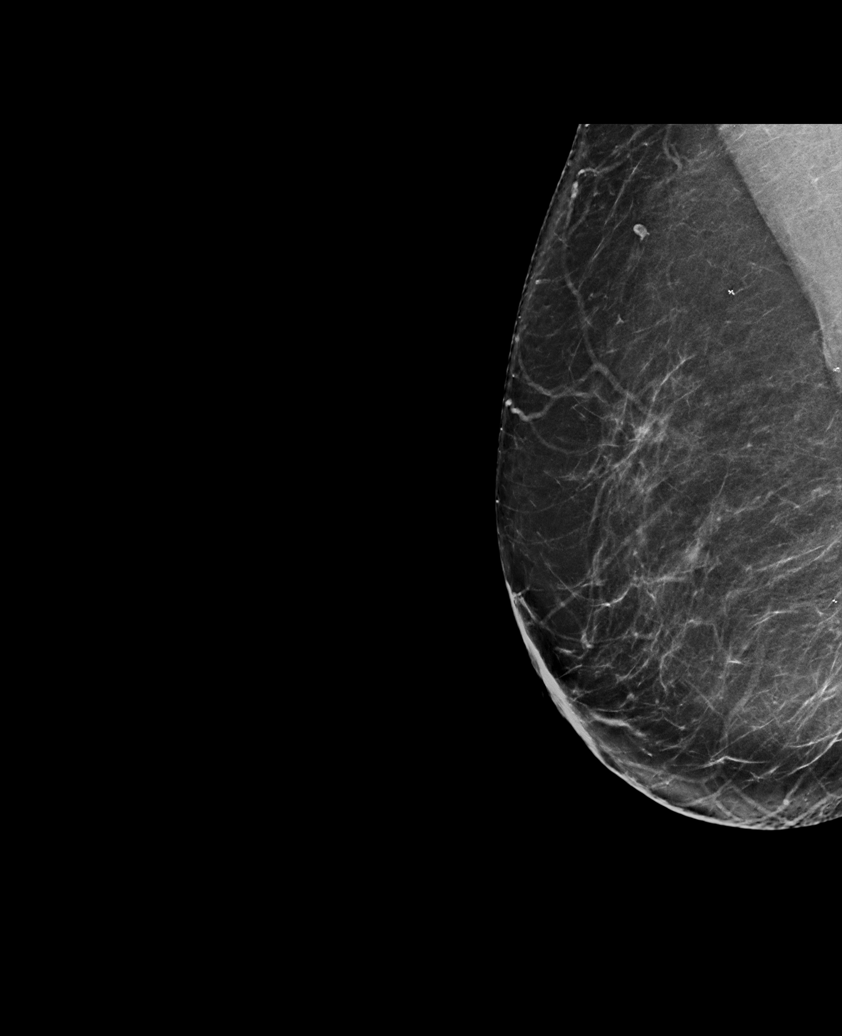

[R MLO synth-2D (2 of 2)]
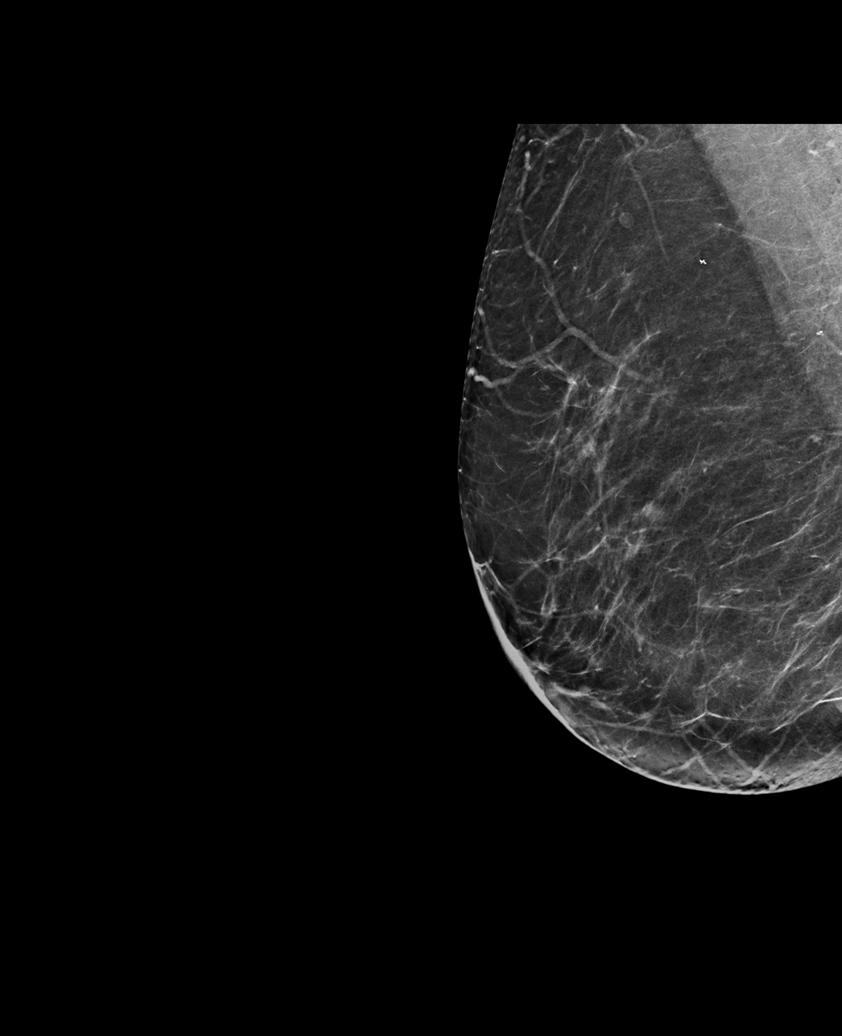

[R CC synth-2D]
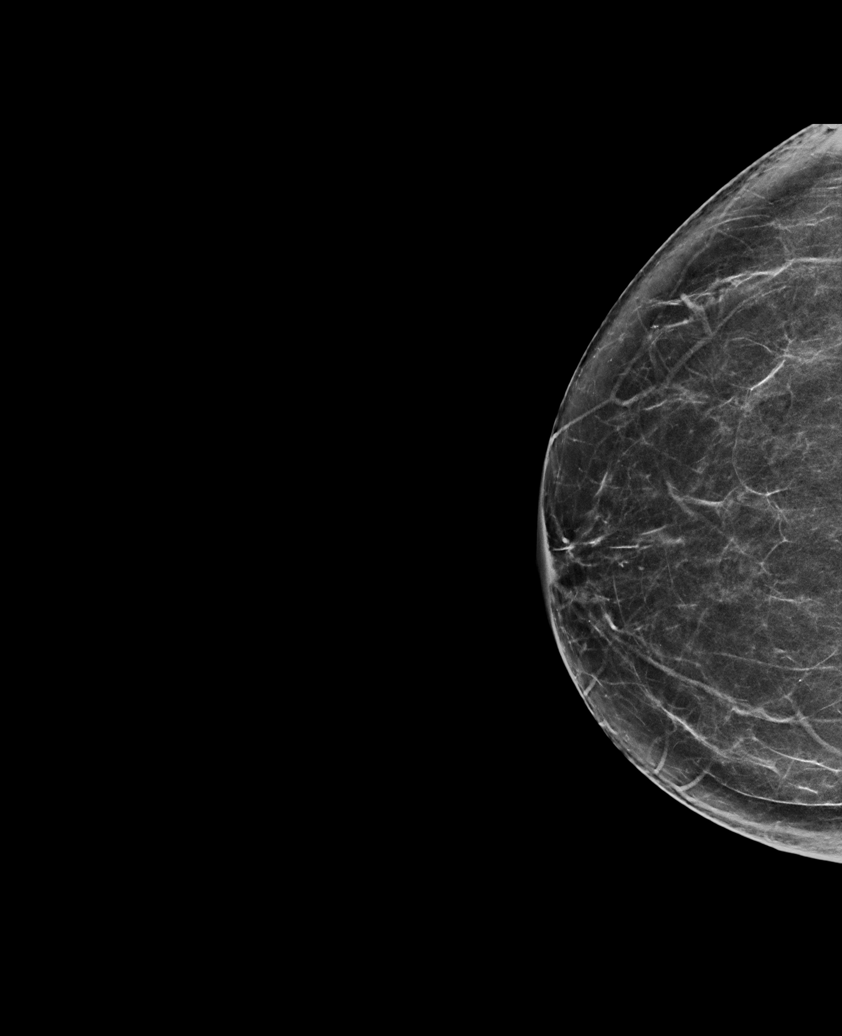

[L MLO synth-2D (2 of 2)]
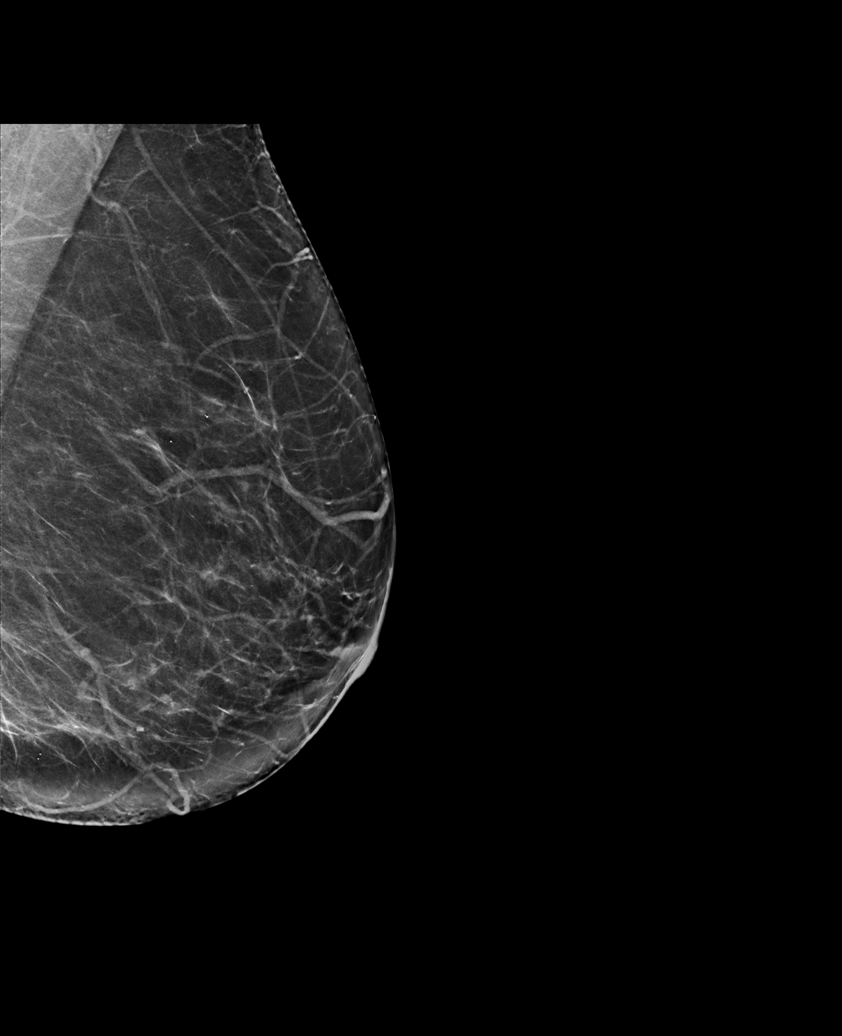

[L CC synth-2D]
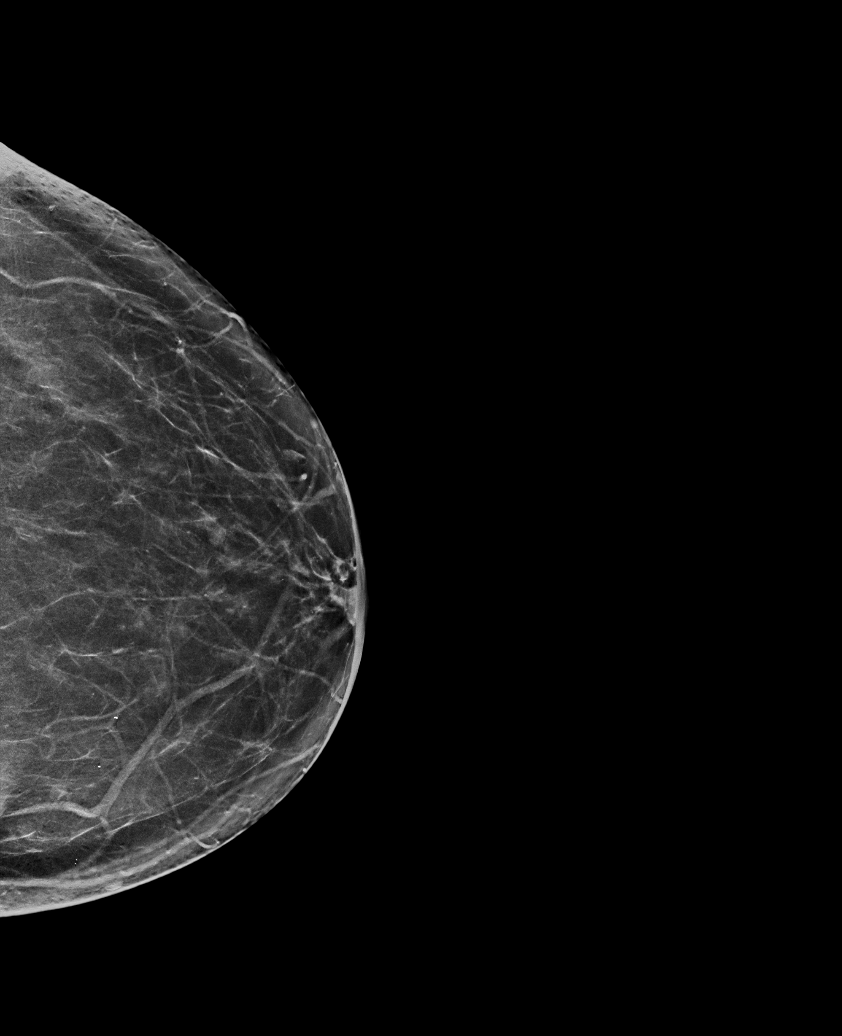

[6 of 36 positions shown; findings below may reference images not displayed]

ACR Breast Density Category b: There are scattered areas of
fibroglandular density.
FINDINGS: There are no findings suspicious for malignancy.
IMPRESSION: No mammographic evidence of malignancy. A result letter of this
screening mammogram will be mailed directly to the patient.

RECOMMENDATION:
Screening mammogram in one year. (Code:51-O-LD2)

BI-RADS CATEGORY  1: Negative.

## 2022-03-06 DIAGNOSIS — M810 Age-related osteoporosis without current pathological fracture: Secondary | ICD-10-CM | POA: Diagnosis not present

## 2022-03-06 DIAGNOSIS — E039 Hypothyroidism, unspecified: Secondary | ICD-10-CM | POA: Diagnosis not present

## 2022-03-06 DIAGNOSIS — R0683 Snoring: Secondary | ICD-10-CM | POA: Diagnosis not present

## 2022-03-06 DIAGNOSIS — E7800 Pure hypercholesterolemia, unspecified: Secondary | ICD-10-CM | POA: Diagnosis not present

## 2022-03-06 DIAGNOSIS — Z0000 Encounter for general adult medical examination without abnormal findings: Secondary | ICD-10-CM | POA: Diagnosis not present

## 2022-03-06 DIAGNOSIS — Z1331 Encounter for screening for depression: Secondary | ICD-10-CM | POA: Diagnosis not present

## 2022-03-06 DIAGNOSIS — N1831 Chronic kidney disease, stage 3a: Secondary | ICD-10-CM | POA: Diagnosis not present

## 2022-04-14 DIAGNOSIS — R748 Abnormal levels of other serum enzymes: Secondary | ICD-10-CM | POA: Diagnosis not present

## 2022-04-24 DIAGNOSIS — G4733 Obstructive sleep apnea (adult) (pediatric): Secondary | ICD-10-CM | POA: Diagnosis not present

## 2022-09-29 ENCOUNTER — Other Ambulatory Visit: Payer: Self-pay | Admitting: Family Medicine

## 2022-09-29 DIAGNOSIS — Z1231 Encounter for screening mammogram for malignant neoplasm of breast: Secondary | ICD-10-CM

## 2022-10-02 ENCOUNTER — Ambulatory Visit
Admission: RE | Admit: 2022-10-02 | Discharge: 2022-10-02 | Disposition: A | Discharge status: Home / Self Care | Payer: Medicare HMO | Source: Ambulatory Visit | Attending: Family Medicine | Admitting: Family Medicine

## 2022-10-02 DIAGNOSIS — Z1231 Encounter for screening mammogram for malignant neoplasm of breast: Secondary | ICD-10-CM

## 2022-11-10 DIAGNOSIS — Z124 Encounter for screening for malignant neoplasm of cervix: Secondary | ICD-10-CM | POA: Diagnosis not present

## 2022-11-10 DIAGNOSIS — Z1331 Encounter for screening for depression: Secondary | ICD-10-CM | POA: Diagnosis not present

## 2023-01-14 DIAGNOSIS — J069 Acute upper respiratory infection, unspecified: Secondary | ICD-10-CM | POA: Diagnosis not present

## 2023-01-14 DIAGNOSIS — Z03818 Encounter for observation for suspected exposure to other biological agents ruled out: Secondary | ICD-10-CM | POA: Diagnosis not present

## 2023-02-11 DIAGNOSIS — Z872 Personal history of diseases of the skin and subcutaneous tissue: Secondary | ICD-10-CM | POA: Diagnosis not present

## 2023-02-11 DIAGNOSIS — Z86018 Personal history of other benign neoplasm: Secondary | ICD-10-CM | POA: Diagnosis not present

## 2023-02-11 DIAGNOSIS — L578 Other skin changes due to chronic exposure to nonionizing radiation: Secondary | ICD-10-CM | POA: Diagnosis not present

## 2023-02-11 DIAGNOSIS — Z85828 Personal history of other malignant neoplasm of skin: Secondary | ICD-10-CM | POA: Diagnosis not present

## 2023-09-07 ENCOUNTER — Other Ambulatory Visit: Payer: Self-pay | Admitting: Family Medicine

## 2023-09-07 DIAGNOSIS — Z1231 Encounter for screening mammogram for malignant neoplasm of breast: Secondary | ICD-10-CM

## 2023-10-05 ENCOUNTER — Ambulatory Visit
Admission: RE | Admit: 2023-10-05 | Discharge: 2023-10-05 | Disposition: A | Discharge status: Home / Self Care | Source: Ambulatory Visit | Attending: Family Medicine | Admitting: Family Medicine

## 2023-10-05 DIAGNOSIS — Z1231 Encounter for screening mammogram for malignant neoplasm of breast: Secondary | ICD-10-CM | POA: Insufficient documentation

## 2024-02-07 ENCOUNTER — Ambulatory Visit: Payer: Self-pay

## 2024-02-07 DIAGNOSIS — D122 Benign neoplasm of ascending colon: Secondary | ICD-10-CM | POA: Diagnosis not present

## 2024-02-07 DIAGNOSIS — Z860101 Personal history of adenomatous and serrated colon polyps: Secondary | ICD-10-CM | POA: Diagnosis not present

## 2024-02-07 DIAGNOSIS — K5730 Diverticulosis of large intestine without perforation or abscess without bleeding: Secondary | ICD-10-CM | POA: Diagnosis not present

## 2024-02-07 DIAGNOSIS — D128 Benign neoplasm of rectum: Secondary | ICD-10-CM | POA: Diagnosis not present

## 2024-02-07 DIAGNOSIS — Z09 Encounter for follow-up examination after completed treatment for conditions other than malignant neoplasm: Secondary | ICD-10-CM | POA: Diagnosis present

## 2024-02-07 DIAGNOSIS — K642 Third degree hemorrhoids: Secondary | ICD-10-CM | POA: Diagnosis not present
